# Patient Record
Sex: Male | Born: 1960
Health system: Southern US, Community
[De-identification: ages and names within clinical notes are randomized; demographics above are authoritative.]

## PROBLEM LIST (undated history)

## (undated) DIAGNOSIS — K219 Gastro-esophageal reflux disease without esophagitis: Secondary | ICD-10-CM

## (undated) DIAGNOSIS — I10 Essential (primary) hypertension: Secondary | ICD-10-CM

## (undated) DIAGNOSIS — J189 Pneumonia, unspecified organism: Secondary | ICD-10-CM

## (undated) DIAGNOSIS — G4730 Sleep apnea, unspecified: Secondary | ICD-10-CM

## (undated) DIAGNOSIS — K5730 Diverticulosis of large intestine without perforation or abscess without bleeding: Secondary | ICD-10-CM

## (undated) HISTORY — DX: Diverticulosis of large intestine without perforation or abscess without bleeding: K57.30

## (undated) HISTORY — DX: Gilbert syndrome: E80.4

## (undated) HISTORY — DX: Essential (primary) hypertension: I10

## (undated) HISTORY — DX: Gastro-esophageal reflux disease without esophagitis: K21.9

## (undated) HISTORY — PX: APPENDECTOMY: SHX54

## (undated) HISTORY — PX: VASECTOMY: SHX75

---

## 2001-03-10 ENCOUNTER — Encounter: Admission: RE | Admit: 2001-03-10 | Discharge: 2001-03-10 | Payer: Self-pay | Admitting: Internal Medicine

## 2001-03-10 ENCOUNTER — Encounter: Payer: Self-pay | Admitting: Internal Medicine

## 2001-03-28 ENCOUNTER — Encounter: Payer: Self-pay | Admitting: Internal Medicine

## 2001-03-28 ENCOUNTER — Encounter: Admission: RE | Admit: 2001-03-28 | Discharge: 2001-03-28 | Payer: Self-pay | Admitting: Internal Medicine

## 2004-02-21 ENCOUNTER — Ambulatory Visit: Payer: Self-pay | Admitting: Internal Medicine

## 2004-10-12 ENCOUNTER — Ambulatory Visit: Payer: Self-pay | Admitting: Internal Medicine

## 2005-02-15 ENCOUNTER — Ambulatory Visit: Payer: Self-pay | Admitting: Internal Medicine

## 2005-06-04 ENCOUNTER — Ambulatory Visit: Payer: Self-pay | Admitting: Internal Medicine

## 2005-06-12 ENCOUNTER — Encounter: Admission: RE | Admit: 2005-06-12 | Discharge: 2005-06-12 | Payer: Self-pay | Admitting: Internal Medicine

## 2005-10-12 ENCOUNTER — Ambulatory Visit: Payer: Self-pay | Admitting: Internal Medicine

## 2005-11-27 ENCOUNTER — Ambulatory Visit: Payer: Self-pay | Admitting: Internal Medicine

## 2006-01-22 ENCOUNTER — Encounter: Admission: RE | Admit: 2006-01-22 | Discharge: 2006-04-22 | Payer: Self-pay | Admitting: Internal Medicine

## 2006-04-08 ENCOUNTER — Ambulatory Visit: Payer: Self-pay | Admitting: Family Medicine

## 2006-08-29 DIAGNOSIS — M109 Gout, unspecified: Secondary | ICD-10-CM

## 2006-11-26 ENCOUNTER — Ambulatory Visit: Payer: Self-pay | Admitting: Internal Medicine

## 2006-11-26 LAB — CONVERTED CEMR LAB
Mumps IgG: 1.13 — ABNORMAL HIGH
Rubella: 21.7 intl units/mL — ABNORMAL HIGH
Rubeola IgG: 2.26 — ABNORMAL HIGH

## 2006-11-28 ENCOUNTER — Ambulatory Visit: Payer: Self-pay | Admitting: Internal Medicine

## 2006-12-03 ENCOUNTER — Encounter (INDEPENDENT_AMBULATORY_CARE_PROVIDER_SITE_OTHER): Payer: Self-pay | Admitting: *Deleted

## 2007-01-13 ENCOUNTER — Ambulatory Visit: Payer: Self-pay | Admitting: Internal Medicine

## 2007-01-14 ENCOUNTER — Encounter (INDEPENDENT_AMBULATORY_CARE_PROVIDER_SITE_OTHER): Payer: Self-pay | Admitting: *Deleted

## 2007-08-28 ENCOUNTER — Telehealth (INDEPENDENT_AMBULATORY_CARE_PROVIDER_SITE_OTHER): Payer: Self-pay | Admitting: *Deleted

## 2007-09-01 ENCOUNTER — Ambulatory Visit: Payer: Self-pay | Admitting: Internal Medicine

## 2007-09-01 DIAGNOSIS — M545 Low back pain: Secondary | ICD-10-CM | POA: Insufficient documentation

## 2007-09-03 ENCOUNTER — Encounter (INDEPENDENT_AMBULATORY_CARE_PROVIDER_SITE_OTHER): Payer: Self-pay | Admitting: *Deleted

## 2007-09-10 ENCOUNTER — Telehealth (INDEPENDENT_AMBULATORY_CARE_PROVIDER_SITE_OTHER): Payer: Self-pay | Admitting: *Deleted

## 2007-09-10 ENCOUNTER — Ambulatory Visit: Payer: Self-pay | Admitting: Pulmonary Disease

## 2007-09-24 ENCOUNTER — Ambulatory Visit (HOSPITAL_BASED_OUTPATIENT_CLINIC_OR_DEPARTMENT_OTHER): Admission: RE | Admit: 2007-09-24 | Discharge: 2007-09-24 | Payer: Self-pay | Admitting: Pulmonary Disease

## 2007-09-24 ENCOUNTER — Ambulatory Visit: Payer: Self-pay | Admitting: Pulmonary Disease

## 2007-10-08 ENCOUNTER — Telehealth: Payer: Self-pay | Admitting: Pulmonary Disease

## 2007-10-30 ENCOUNTER — Encounter: Payer: Self-pay | Admitting: Internal Medicine

## 2007-12-15 ENCOUNTER — Ambulatory Visit: Payer: Self-pay | Admitting: Family Medicine

## 2007-12-15 LAB — CONVERTED CEMR LAB
Inflenza A Ag: NEGATIVE
Influenza B Ag: NEGATIVE

## 2007-12-22 ENCOUNTER — Ambulatory Visit: Payer: Self-pay | Admitting: Internal Medicine

## 2008-02-12 ENCOUNTER — Ambulatory Visit: Payer: Self-pay | Admitting: Internal Medicine

## 2008-02-13 ENCOUNTER — Encounter (INDEPENDENT_AMBULATORY_CARE_PROVIDER_SITE_OTHER): Payer: Self-pay | Admitting: *Deleted

## 2008-08-11 ENCOUNTER — Ambulatory Visit: Payer: Self-pay | Admitting: Family Medicine

## 2008-10-29 ENCOUNTER — Ambulatory Visit: Payer: Self-pay | Admitting: Internal Medicine

## 2008-11-01 ENCOUNTER — Ambulatory Visit: Payer: Self-pay | Admitting: Internal Medicine

## 2008-11-01 LAB — CONVERTED CEMR LAB
ALT: 28 units/L (ref 0–53)
AST: 23 units/L (ref 0–37)
Albumin: 4.3 g/dL (ref 3.5–5.2)
Alkaline Phosphatase: 60 units/L (ref 39–117)
BUN: 14 mg/dL (ref 6–23)
Basophils Absolute: 0 10*3/uL (ref 0.0–0.1)
Basophils Relative: 0.4 % (ref 0.0–3.0)
Bilirubin, Direct: 0.1 mg/dL (ref 0.0–0.3)
CO2: 31 meq/L (ref 19–32)
Calcium: 9.4 mg/dL (ref 8.4–10.5)
Chloride: 105 meq/L (ref 96–112)
Cholesterol: 144 mg/dL (ref 0–200)
Creatinine, Ser: 1.1 mg/dL (ref 0.4–1.5)
Eosinophils Absolute: 0.1 10*3/uL (ref 0.0–0.7)
Eosinophils Relative: 2.3 % (ref 0.0–5.0)
GFR calc non Af Amer: 75.53 mL/min (ref >60)
Glucose, Bld: 107 mg/dL — ABNORMAL HIGH (ref 70–99)
HCT: 46 % (ref 39.0–52.0)
HDL: 49.4 mg/dL (ref >39.00)
Hemoglobin: 16 g/dL (ref 13.0–17.0)
LDL Cholesterol: 83 mg/dL (ref 0–99)
Lymphocytes Relative: 34.4 % (ref 12.0–46.0)
Lymphs Abs: 1.7 10*3/uL (ref 0.7–4.0)
MCHC: 34.9 g/dL (ref 30.0–36.0)
MCV: 86.1 fL (ref 78.0–100.0)
Monocytes Absolute: 0.3 10*3/uL (ref 0.1–1.0)
Monocytes Relative: 7.2 % (ref 3.0–12.0)
Neutro Abs: 2.7 10*3/uL (ref 1.4–7.7)
Neutrophils Relative %: 55.7 % (ref 43.0–77.0)
PSA: 0.56 ng/mL (ref 0.10–4.00)
Platelets: 188 10*3/uL (ref 150.0–400.0)
Potassium: 4.1 meq/L (ref 3.5–5.1)
RBC: 5.35 M/uL (ref 4.22–5.81)
RDW: 12.5 % (ref 11.5–14.6)
Sodium: 142 meq/L (ref 135–145)
TSH: 1.47 microintl units/mL (ref 0.35–5.50)
Total Bilirubin: 1.2 mg/dL (ref 0.3–1.2)
Total CHOL/HDL Ratio: 3
Total Protein: 7.3 g/dL (ref 6.0–8.3)
Triglycerides: 60 mg/dL (ref 0.0–149.0)
VLDL: 12 mg/dL (ref 0.0–40.0)
WBC: 4.8 10*3/uL (ref 4.5–10.5)

## 2008-11-02 ENCOUNTER — Encounter (INDEPENDENT_AMBULATORY_CARE_PROVIDER_SITE_OTHER): Payer: Self-pay | Admitting: *Deleted

## 2008-11-02 ENCOUNTER — Encounter: Payer: Self-pay | Admitting: Internal Medicine

## 2008-11-02 LAB — CONVERTED CEMR LAB: Uric Acid, Serum: 9 mg/dL — ABNORMAL HIGH (ref 4.0–7.8)

## 2008-11-03 ENCOUNTER — Ambulatory Visit: Payer: Self-pay | Admitting: Internal Medicine

## 2008-11-03 DIAGNOSIS — G479 Sleep disorder, unspecified: Secondary | ICD-10-CM | POA: Insufficient documentation

## 2008-11-05 ENCOUNTER — Encounter (INDEPENDENT_AMBULATORY_CARE_PROVIDER_SITE_OTHER): Payer: Self-pay | Admitting: *Deleted

## 2009-02-09 ENCOUNTER — Ambulatory Visit: Payer: Self-pay | Admitting: Internal Medicine

## 2009-02-09 DIAGNOSIS — T887XXA Unspecified adverse effect of drug or medicament, initial encounter: Secondary | ICD-10-CM

## 2009-02-10 LAB — CONVERTED CEMR LAB
BUN: 18 mg/dL (ref 6–23)
Creatinine, Ser: 1.1 mg/dL (ref 0.4–1.5)

## 2009-02-11 ENCOUNTER — Telehealth (INDEPENDENT_AMBULATORY_CARE_PROVIDER_SITE_OTHER): Payer: Self-pay | Admitting: *Deleted

## 2009-02-11 ENCOUNTER — Encounter (INDEPENDENT_AMBULATORY_CARE_PROVIDER_SITE_OTHER): Payer: Self-pay | Admitting: *Deleted

## 2009-03-24 ENCOUNTER — Ambulatory Visit: Payer: Self-pay | Admitting: Internal Medicine

## 2009-03-24 ENCOUNTER — Encounter (INDEPENDENT_AMBULATORY_CARE_PROVIDER_SITE_OTHER): Payer: Self-pay | Admitting: *Deleted

## 2009-04-02 HISTORY — PX: COLONOSCOPY: SHX174

## 2009-05-25 ENCOUNTER — Telehealth (INDEPENDENT_AMBULATORY_CARE_PROVIDER_SITE_OTHER): Payer: Self-pay | Admitting: *Deleted

## 2009-08-15 ENCOUNTER — Ambulatory Visit: Payer: Self-pay | Admitting: Internal Medicine

## 2009-08-15 DIAGNOSIS — D487 Neoplasm of uncertain behavior of other specified sites: Secondary | ICD-10-CM | POA: Insufficient documentation

## 2009-11-04 ENCOUNTER — Ambulatory Visit: Payer: Self-pay | Admitting: Internal Medicine

## 2009-11-04 ENCOUNTER — Encounter: Payer: Self-pay | Admitting: Gastroenterology

## 2009-11-30 ENCOUNTER — Telehealth (INDEPENDENT_AMBULATORY_CARE_PROVIDER_SITE_OTHER): Payer: Self-pay | Admitting: *Deleted

## 2009-12-06 ENCOUNTER — Encounter (INDEPENDENT_AMBULATORY_CARE_PROVIDER_SITE_OTHER): Payer: Self-pay | Admitting: *Deleted

## 2009-12-07 ENCOUNTER — Ambulatory Visit: Payer: Self-pay | Admitting: Gastroenterology

## 2009-12-19 ENCOUNTER — Ambulatory Visit: Payer: Self-pay | Admitting: Gastroenterology

## 2010-01-05 ENCOUNTER — Telehealth (INDEPENDENT_AMBULATORY_CARE_PROVIDER_SITE_OTHER): Payer: Self-pay | Admitting: *Deleted

## 2010-01-12 ENCOUNTER — Encounter: Payer: Self-pay | Admitting: Internal Medicine

## 2010-01-18 ENCOUNTER — Encounter: Payer: Self-pay | Admitting: Internal Medicine

## 2010-01-25 ENCOUNTER — Encounter: Payer: Self-pay | Admitting: Internal Medicine

## 2010-03-06 ENCOUNTER — Ambulatory Visit: Payer: Self-pay | Admitting: Internal Medicine

## 2010-03-06 LAB — CONVERTED CEMR LAB
Bilirubin Urine: NEGATIVE
Blood in Urine, dipstick: NEGATIVE
Ketones, urine, test strip: NEGATIVE
Nitrite: NEGATIVE
Specific Gravity, Urine: 1.015
pH: 6

## 2010-03-09 ENCOUNTER — Encounter: Payer: Self-pay | Admitting: Internal Medicine

## 2010-04-30 LAB — CONVERTED CEMR LAB
ALT: 26 units/L (ref 0–53)
ALT: 34 units/L (ref 0–53)
AST: 20 units/L (ref 0–37)
AST: 25 units/L (ref 0–37)
AST: 62 units/L — ABNORMAL HIGH (ref 0–37)
Albumin: 4.2 g/dL (ref 3.5–5.2)
Albumin: 4.4 g/dL (ref 3.5–5.2)
Albumin: 4.4 g/dL (ref 3.5–5.2)
Alkaline Phosphatase: 48 units/L (ref 39–117)
Alkaline Phosphatase: 53 units/L (ref 39–117)
Alkaline Phosphatase: 53 units/L (ref 39–117)
BUN: 11 mg/dL (ref 6–23)
BUN: 13 mg/dL (ref 6–23)
Basophils Absolute: 0 10*3/uL (ref 0.0–0.1)
Basophils Absolute: 0 10*3/uL (ref 0.0–0.1)
Basophils Absolute: 0 10*3/uL (ref 0.0–0.1)
Basophils Relative: 0.5 % (ref 0.0–3.0)
Basophils Relative: 0.7 % (ref 0.0–3.0)
Basophils Relative: 0.8 % (ref 0.0–1.0)
Bilirubin Urine: NEGATIVE
Bilirubin, Direct: 0.1 mg/dL (ref 0.0–0.3)
Bilirubin, Direct: 0.1 mg/dL (ref 0.0–0.3)
Blood in Urine, dipstick: NEGATIVE
CO2: 29 meq/L (ref 19–32)
CO2: 32 meq/L (ref 19–32)
Calcium: 9.4 mg/dL (ref 8.4–10.5)
Calcium: 9.5 mg/dL (ref 8.4–10.5)
Calcium: 9.8 mg/dL (ref 8.4–10.5)
Chloride: 102 meq/L (ref 96–112)
Chloride: 103 meq/L (ref 96–112)
Cholesterol: 157 mg/dL (ref 0–200)
Cholesterol: 163 mg/dL (ref 0–200)
Creatinine, Ser: 0.9 mg/dL (ref 0.4–1.5)
Creatinine, Ser: 1.1 mg/dL (ref 0.4–1.5)
Eosinophils Absolute: 0.1 10*3/uL (ref 0.0–0.6)
Eosinophils Absolute: 0.1 10*3/uL (ref 0.0–0.7)
Eosinophils Relative: 1.9 % (ref 0.0–5.0)
Eosinophils Relative: 2.3 % (ref 0.0–5.0)
Free T4: 1 ng/dL (ref 0.6–1.6)
GFR calc Af Amer: 116 mL/min
GFR calc Af Amer: 92 mL/min
GFR calc non Af Amer: 76 mL/min
GFR calc non Af Amer: 83.96 mL/min (ref >60)
GFR calc non Af Amer: 96 mL/min
Glucose, Bld: 103 mg/dL — ABNORMAL HIGH (ref 70–99)
Glucose, Bld: 97 mg/dL (ref 70–99)
Glucose, Urine, Semiquant: NEGATIVE
HCT: 46.5 % (ref 39.0–52.0)
HCT: 48.5 % (ref 39.0–52.0)
HDL: 45.6 mg/dL (ref >39.0)
HDL: 50.8 mg/dL (ref >39.0)
HDL: 54.4 mg/dL (ref >39.00)
Hemoglobin: 15.9 g/dL (ref 13.0–17.0)
Hemoglobin: 16.1 g/dL (ref 13.0–17.0)
Hemoglobin: 16.5 g/dL (ref 13.0–17.0)
Hgb A1c MFr Bld: 5.2 % (ref 4.6–6.5)
Ketones, urine, test strip: NEGATIVE
LDL Cholesterol: 105 mg/dL — ABNORMAL HIGH (ref 0–99)
LDL Cholesterol: 95 mg/dL (ref 0–99)
Lymphocytes Relative: 31.7 % (ref 12.0–46.0)
Lymphocytes Relative: 32.4 % (ref 12.0–46.0)
Lymphocytes Relative: 35.9 % (ref 12.0–46.0)
MCHC: 34.1 g/dL (ref 30.0–36.0)
MCHC: 34.1 g/dL (ref 30.0–36.0)
MCV: 85.9 fL (ref 78.0–100.0)
MCV: 86.6 fL (ref 78.0–100.0)
Monocytes Absolute: 0.4 10*3/uL (ref 0.1–1.0)
Monocytes Absolute: 0.4 10*3/uL (ref 0.2–0.7)
Monocytes Relative: 7.3 % (ref 3.0–12.0)
Monocytes Relative: 7.7 % (ref 3.0–11.0)
Monocytes Relative: 7.8 % (ref 3.0–12.0)
Neutro Abs: 3.1 10*3/uL (ref 1.4–7.7)
Neutro Abs: 3.2 10*3/uL (ref 1.4–7.7)
Neutro Abs: 3.3 10*3/uL (ref 1.4–7.7)
Neutrophils Relative %: 57.4 % (ref 43.0–77.0)
Neutrophils Relative %: 57.5 % (ref 43.0–77.0)
Nitrite: NEGATIVE
PSA: 0.6 ng/mL (ref 0.10–4.00)
PSA: 0.69 ng/mL (ref 0.10–4.00)
Platelets: 190 10*3/uL (ref 150–400)
Platelets: 214 10*3/uL (ref 150–400)
Potassium: 3.9 meq/L (ref 3.5–5.1)
Potassium: 4 meq/L (ref 3.5–5.1)
Protein, U semiquant: NEGATIVE
RBC: 5.23 M/uL (ref 4.22–5.81)
RBC: 5.42 M/uL (ref 4.22–5.81)
RBC: 5.6 M/uL (ref 4.22–5.81)
RDW: 12.2 % (ref 11.5–14.6)
RDW: 12.6 % (ref 11.5–14.6)
RDW: 13.2 % (ref 11.5–14.6)
Sodium: 138 meq/L (ref 135–145)
Sodium: 139 meq/L (ref 135–145)
Sodium: 142 meq/L (ref 135–145)
Specific Gravity, Urine: <1.005
TSH: 1.45 microintl units/mL (ref 0.35–5.50)
TSH: 1.69 microintl units/mL (ref 0.35–5.50)
Total Bilirubin: 0.9 mg/dL (ref 0.3–1.2)
Total Bilirubin: 1 mg/dL (ref 0.3–1.2)
Total CHOL/HDL Ratio: 3
Total CHOL/HDL Ratio: 3.1
Total CHOL/HDL Ratio: 3.6
Total Protein: 6.7 g/dL (ref 6.0–8.3)
Total Protein: 7.1 g/dL (ref 6.0–8.3)
Triglycerides: 54 mg/dL (ref 0–149)
Triglycerides: 61 mg/dL (ref 0–149)
Uric Acid, Serum: 7.6 mg/dL (ref 4.0–7.8)
Urobilinogen, UA: 0.2
VLDL: 11 mg/dL (ref 0–40)
VLDL: 12 mg/dL (ref 0–40)
VLDL: 12.6 mg/dL (ref 0.0–40.0)
WBC Urine, dipstick: NEGATIVE
WBC: 5.2 10*3/uL (ref 4.5–10.5)
WBC: 5.5 10*3/uL (ref 4.5–10.5)
pH: 6.5

## 2010-05-02 NOTE — Procedures (Signed)
Summary: Colonoscopy  Patient: Eric Aguirre Note: All result statuses are Final unless otherwise noted.  Tests: (1) Colonoscopy (COL)   COL Colonoscopy           DONE     [facility]     [address]     [place], [place]  [address]           COLONOSCOPY PROCEDURE REPORT           PATIENT:  [name], [name]  MR#:  [id number]     BIRTHDATE:  December 29, 1960, 49 yrs. old  GENDER:  male     ENDOSCOPIST:  Vania Rea. Jarold Motto, MD, [facility]     REF. BY:  Marga Melnick, M.D.     PROCEDURE DATE:  12/19/2009     PROCEDURE:  Average-risk screening colonoscopy     G0121     ASA CLASS:  Class I     INDICATIONS:  Routine Risk Screening     MEDICATIONS:   Fentanyl 100 mcg IV, Versed 10 mg IV           DESCRIPTION OF PROCEDURE:   After the risks benefits and     alternatives of the procedure were thoroughly explained, informed     consent was obtained.  Digital rectal exam was performed and     revealed no abnormalities.   The LB CF-[id number] endoscope     was introduced through the anus and advanced to the cecum, which     was identified by both the appendix and ileocecal valve, without     limitations.  The quality of the prep was excellent, using     MoviPrep.  The instrument was then slowly withdrawn as the colon     was fully examined.     <<PROCEDUREIMAGES>>           FINDINGS:  Mild diverticulosis was found in the sigmoid to     descending colon segments. ALSO SCATTERED RIGHT COLON DIVERTICULAE     NOTED.  No polyps or cancers were seen.   Retroflexed views in the     rectum revealed no abnormalities.    The scope was then withdrawn     from the patient and the procedure completed.           COMPLICATIONS:  None     ENDOSCOPIC IMPRESSION:     1) Mild diverticulosis in the sigmoid to descending colon     segments     2) No polyps or cancers     3) Normal colonoscopy otherwise     RECOMMENDATIONS:     1) high fiber diet     2) Continue current colorectal screening  recommendations for     "routine risk" patients with a repeat colonoscopy in 10 years.     REPEAT EXAM:  No           ______________________________     Vania Rea. Jarold Motto, MD, [facility]           CC:           n.     eSIGNED:   Vania Rea. Patterson at 12/19/2009 11:40 AM           Charlane Ferretti, [id number]  Note: An exclamation mark (!) indicates a result that was not dispersed into the flowsheet. Document Creation Date: 12/19/2009 11:40 AM _______________________________________________________________________  (1) Order result status: Final Collection or observation date-time: 12/19/2009 11:32 Requested date-time:  Receipt date-time:  Reported date-time:  Referring Physician:   Ordering Physician: Sheryn Bison [phone]) Specimen Source:  Source: Launa Grill Order Number: [id number] Lab site:   Appended Document: Colonoscopy    Clinical Lists Changes  Observations: Added new observation of COLONNXTDUE: 12/2019 (12/19/2009 12:36)

## 2010-05-02 NOTE — Miscellaneous (Signed)
Summary: [facility]  Clinical Lists Changes  Medications: Added new medication of MOVIPREP 100 GM  SOLR (PEG-KCL-NACL-NASULF-NA ASC-C) As per prep instructions. - Signed Rx of MOVIPREP 100 GM  SOLR (PEG-KCL-NACL-NASULF-NA ASC-C) As per prep instructions.;  #1 x 0;  Signed;  Entered by: Ezra Sites RN;  Authorized by: Mardella Layman MD [facility];  Method used: Electronically to [facility]. [place]. [phone]*, [address] [place], [place], [place]  [address], Ph: [phone] or [phone], Fax: [phone] Observations: Added new observation of ALLERGY REV: Done (12/07/2009 14:12)    Prescriptions: MOVIPREP 100 GM  SOLR (PEG-KCL-NACL-NASULF-NA ASC-C) As per prep instructions.  #1 x 0   Entered by:   Ezra Sites RN   Authorized by:   Mardella Layman MD [facility]   Signed by:   Ezra Sites RN on 12/07/2009   Method used:   Electronically to        [facility]. [address] [phone]* (retail)       [address]       [place], [place]  [address]       Ph: [phone] or [phone]       Fax: [phone]   RxID:   [id number]

## 2010-05-02 NOTE — Progress Notes (Signed)
Summary: refill  Phone Note Refill Request Call back at Work Phone [phone]   Refills Requested: Medication #1:  COLCRYS 0.6 MG TABS as directed.  pt states that he curently has a acute gout flare up. pt is currently out of this med and needs a refill.pt  [facility]..........Marland KitchenFelecia Deloach CMA  November 30, 2009 10:29 AM    Follow-up for Phone Call        Patient was given rx on 11/04/2009 #30 with 5 refills.  I left message on VM informing patient that he was given rx and would need to forward rx to the pharmacy.  If patient still needs our assistance patient to call Follow-up by: Shonna Chock CMA,  November 30, 2009 11:33 AM

## 2010-05-02 NOTE — Assessment & Plan Note (Signed)
Summary: CPX/KDC   Vital Signs:  Patient profile:   50 year old male Height:      68.5 inches Weight:      176.4 pounds BMI:     26.53 Temp:     98.2 degrees F oral Pulse rate:   60 / minute Resp:     14 per minute BP sitting:   102 / 66  (left arm) Cuff size:   large  Vitals Entered By: Shonna Chock CMA (November 04, 2009 8:12 AM)  CC: CPX with fasting labs , General Medical Evaluation   Primary Care Provider:  Marga Melnick MD  CC:  CPX with fasting labs  and General Medical Evaluation.  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   Allergies: 1)  ! Tussionex Pennkinetic Er (Chlorpheniramine-Hydrocodone) 2)  ! Augmentin (Amoxicillin-Pot Clavulanate) 3)  ! Uloric (Febuxostat)  Past History:  Past Medical History: LOW BACK PAIN, CHRONIC  GERD (ICD-530.81) GILBERT'S SYNDROME (ICD-277.4) (total bilirubin 1.6 in 2007) GOUT (ICD-274.9) (Uric Acid 9.0 in 09/2008)  Past Surgical History: Vasectomy; Lasik Ophthalmologic  Surgery bilaterally; Wisdom Teeth Extraction  Family History: Father:HTN, cns aneurysm, DM   Mother: goiter, lipids, dementia( Frontal Lobe) Siblings: sister: fibromyalgia; P uncle:cancer ? primary; MGGM :arthritis, breast cancer ; MGF: DM; MGM: DM  Social History: Never Smoked Alcohol use-yes: socially 2  cups coffee daily Married - Nature conservation officer Rep with [employer]  Regular exercise-yes: walking & "Reformer" , total 4 hrs /week  Review of Systems General:  Complains of sleep disorder; denies chills, fatigue, fever, and sweats; Difficulty falling asleep & frequent awakening. Eyes:  Denies blurring, double vision, and vision loss-both eyes. ENT:  Denies difficulty swallowing, hoarseness, and ringing in ears; "Hearing Attention Deficit" as per Audiologist. CV:  Denies chest pain or discomfort, palpitations, shortness of breath with exertion,  swelling of feet, and swelling of hands. Resp:  Denies cough, excessive snoring, hypersomnolence, morning headaches, and sputum productive. GI:  Denies abdominal pain, bloody stools, constipation, dark tarry stools, diarrhea, and indigestion; Burping at bedtime . GU:  Denies discharge, dysuria, and hematuria. MS:  Complains of low back pain; denies joint pain, joint redness, joint swelling, mid back pain, and thoracic pain; No recent gout. Derm:  Denies changes in nail beds, dryness, lesion(s), and rash; Some thinning . Neuro:  Denies numbness, tingling, and weakness. Psych:  Denies anxiety, depression, easily angered, easily tearful, and irritability. Endo:  Denies cold intolerance, excessive hunger, excessive thirst, excessive urination, and heat intolerance. Heme:  Denies abnormal bruising and bleeding. Allergy:  Denies itching eyes and sneezing.  Physical Exam  General:  well-nourished; alert,appropriate and cooperative throughout examination Head:  Normocephalic and atraumatic without obvious abnormalities. Eyes:  No corneal or conjunctival inflammation noted.Perrla. Funduscopic exam benign, without hemorrhages, exudates or papilledema.  Ears:  External ear exam shows no significant lesions or deformities.  Otoscopic examination reveals clear canals, tympanic membranes are intact bilaterally without bulging, retraction, inflammation or discharge. Nose:  External nasal examination shows no deformity or inflammation. Nasal mucosa are pink and moist without lesions or exudates. Mouth:  Oral mucosa and oropharynx without lesions or exudates.  Teeth in good repair. Neck:  No deformities, masses, or tenderness noted.? cervical rib on L Lungs:  Normal respiratory effort, chest expands symmetrically. Lungs are clear to auscultation, no crackles or wheezes. Heart:  regular rhythm, no murmur, no gallop, no rub, no  JVD, no HJR, and bradycardia.   Abdomen:  Bowel sounds positive,abdomen soft and  non-tender without masses, organomegaly or hernias noted. Rectal:  No external abnormalities noted. Normal sphincter tone. No rectal masses or tenderness. Genitalia:  Testes bilaterally descended without nodularity, tenderness or masses. No scrotal masses or lesions. No penis lesions or urethral discharge. L varicocele.   Prostate:  Prostate gland firm and smooth, ULN   w/o  enlargement; no  nodularity, tenderness, mass, asymmetry or induration. Msk:  No deformity or scoliosis noted of thoracic or lumbar spine.   Pulses:  R and L carotid,radial,dorsalis pedis and posterior tibial pulses are full and equal bilaterally Extremities:  No clubbing, cyanosis, edema, or deformity noted with normal full range of motion of all joints.   Minor ridging of L great toenail Neurologic:  alert & oriented X3, gait normal, and DTRs symmetrical and normal.   Skin:  Intact without suspicious lesions or rashes Cervical Nodes:  No lymphadenopathy noted Axillary Nodes:  No palpable lymphadenopathy Inguinal Nodes:  No significant adenopathy Psych:  memory intact for recent and remote, normally interactive, and good eye contact.     Impression & Recommendations:  Problem # 1:  ROUTINE GENERAL MEDICAL EXAM@HEALTH  CARE FACL (ICD-V70.0)  Orders: EKG w/ Interpretation (93[address]) Venipuncture ([address]) TLB-Lipid Panel (80061-LIPID) TLB-BMP (Basic Metabolic Panel-BMET) (80048-METABOL) TLB-CBC Platelet - w/Differential (85025-CBCD) TLB-Hepatic/Liver Function Pnl (80076-HEPATIC) TLB-TSH (Thyroid Stimulating Hormone) (84443-TSH) TLB-Uric Acid, Blood (84550-URIC) TLB-PSA (Prostate Specific Antigen) (84153-PSA)  Problem # 2:  SLEEP DISORDER (ICD-780.50)  Orders: Misc. Referral (Misc. Ref)  Problem # 3:  LOW BACK PAIN, CHRONIC (ICD-724.2) Stable , better with Reformer  Problem # 4:  GOUT (ICD-274.9)  The following medications were removed from the medication list:    Colchicine 0.6 Mg Tabs (Colchicine) .Marland Kitchen... Take as  directed His updated medication list for this problem includes:    Allopurinol 300 Mg Tabs (Allopurinol) .Marland Kitchen... 1 once daily    Colcrys 0.6 Mg Tabs (Colchicine) .Marland Kitchen... As directed  Orders: Venipuncture ([address]) TLB-Uric Acid, Blood (84550-URIC)  Complete Medication List: 1)  Clonazepam 0.5 Mg Tabs (Clonazepam) .Marland Kitchen.. 1-2 at bedtime as needed 2)  Fish Oil 1000 Mg Caps (Omega-3 fatty acids) .Marland Kitchen.. 1 by mouth once daily 3)  Vitamin C Cr 1500 Mg Cr-tabs (Ascorbic acid) .Marland Kitchen.. 1 by mouth once daily 4)  Allopurinol 300 Mg Tabs (Allopurinol) .Marland Kitchen.. 1 once daily 5)  Vitamin E 400 Unit Caps (Vitamin e) .Marland Kitchen.. 1 by mouth once daily 6)  Multivitamins Tabs (Multiple vitamin) .Marland Kitchen.. 1 by mouth once daily 7)  Colcrys 0.6 Mg Tabs (Colchicine) .... As directed  Other Orders: Gastroenterology Referral (GI)  Patient Instructions: 1)  The Reformer Pilates Program is medically indicated prophylactically for your Chronic Low Back Pain up to 3X/ week. Prescriptions: COLCRYS 0.6 MG TABS (COLCHICINE) as directed  #30 x 5   Entered and Authorized by:   Marga Melnick MD   Signed by:   Marga Melnick MD on 11/04/2009   Method used:   Print then Give to Patient   RxID:   [id number]     Appended Document: CPX/KDC

## 2010-05-02 NOTE — Letter (Signed)
Summary: [facility] Instructions  [facility]  [address] [place], [place] [address]   Phone: [phone]  Fax: [phone]       ABDIRAHIM FLAVELL    Feb 04, 1961    MRN: [id number]        Procedure Day Dorna Bloom: Duanne Limerick  12/19/09     Arrival Time: 10:30am     Procedure Time: 11:30am     Location of Procedure:                    _X _  [facility] (4th Floor)                        PREPARATION FOR COLONOSCOPY WITH MOVIPREP   Starting 5 days prior to your procedure  [facility] 09/14  do not eat nuts, seeds, popcorn, corn, beans, peas,  salads, or any raw vegetables.  Do not take any fiber supplements (e.g. Metamucil, Citrucel, and Benefiber).  THE DAY BEFORE YOUR PROCEDURE         DATE:  SUNDAY  09/18  1.  Drink clear liquids the entire day-NO SOLID FOOD  2.  Do not drink anything colored red or purple.  Avoid juices with pulp.  No orange juice.  3.  Drink at least 64 oz. (8 glasses) of fluid/clear liquids during the day to prevent dehydration and help the prep work efficiently.  CLEAR LIQUIDS INCLUDE: Water Jello Ice Popsicles Tea (sugar ok, no milk/cream) Powdered fruit flavored drinks Coffee (sugar ok, no milk/cream) Gatorade Juice: apple, white grape, white cranberry  Lemonade Clear bullion, consomm, broth Carbonated beverages (any kind) Strained chicken noodle soup Hard Candy                             4.  In the morning, mix first dose of MoviPrep solution:    Empty 1 Pouch A and 1 Pouch B into the disposable container    Add lukewarm drinking water to the top line of the container. Mix to dissolve    Refrigerate (mixed solution should be used within 24 hrs)  5.  Begin drinking the prep at 5:00 p.m. The MoviPrep container is divided by 4 marks.   Every 15 minutes drink the solution down to the next mark (approximately 8 oz) until the full liter is complete.   6.  Follow completed prep with 16 oz of clear liquid of your choice  (Nothing red or purple).  Continue to drink clear liquids until bedtime.  7.  Before going to bed, mix second dose of MoviPrep solution:    Empty 1 Pouch A and 1 Pouch B into the disposable container    Add lukewarm drinking water to the top line of the container. Mix to dissolve    Refrigerate  THE DAY OF YOUR PROCEDURE      DATE:  MONDAY  09/19  Beginning at  6:30 a.m. (5 hours before procedure):         1. Every 15 minutes, drink the solution down to the next mark (approx 8 oz) until the full liter is complete.  2. Follow completed prep with 16 oz. of clear liquid of your choice.    3. You may drink clear liquids until 9:30am  (2 HOURS BEFORE PROCEDURE).   MEDICATION INSTRUCTIONS  Unless otherwise instructed, you should take regular prescription medications with a small sip of water   as early as  possible the morning of your procedure.           OTHER INSTRUCTIONS  You will need a responsible adult at least 51 years of age to accompany you and drive you home.   This person must remain in the waiting room during your procedure.  Wear loose fitting clothing that is easily removed.  Leave jewelry and other valuables at home.  However, you may wish to bring a book to read or  an iPod/MP3 player to listen to music as you wait for your procedure to start.  Remove all body piercing jewelry and leave at home.  Total time from sign-in until discharge is approximately 2-3 hours.  You should go home directly after your procedure and rest.  You can resume normal activities the  day after your procedure.  The day of your procedure you should not:   Drive   Make legal decisions   Operate machinery   Drink alcohol   Return to work  You will receive specific instructions about eating, activities and medications before you leave.    The above instructions have been reviewed and explained to me by   Ezra Sites RN  September  7, [birth year] 2011 2:44 PM     I fully  understand and can verbalize these instructions _____________________________ Date _________

## 2010-05-02 NOTE — Consult Note (Signed)
Summary: [facility]   Imported By: Lanelle Bal 01/31/2010 08:37:03  _____________________________________________________________________  External Attachment:    Type:   Image     Comment:   External Document

## 2010-05-02 NOTE — Assessment & Plan Note (Signed)
Summary: infected cold sore//lch   Vital Signs:  Patient profile:   50 year old male Weight:      176.8 pounds BMI:     26.20 Temp:     98.4 degrees F oral Pulse rate:   72 / minute Resp:     14 per minute BP sitting:   116 / 78  (left arm) Cuff size:   large  Vitals Entered By: Shonna Chock (Aug 15, 2009 4:35 PM) CC: 1. Infected cold sore   2.) Discuss Meds  Comments REVIEWED MED LIST, PATIENT AGREED DOSE AND INSTRUCTION CORRECT    Primary Care Provider:  Marga Melnick MD  CC:  1. Infected cold sore   2.) Discuss Meds .  History of Present Illness: Jillyn Hidden has an infected herpes labialis ; it started 2 weeks ago as a red ,raised lesion w/o vesicleor pustule .  Marland Kitchen He took Famcyclovir 2 pills two times a day w/o improvement. Subsequently he cut it shaving repeatedly . There has been  no purulence even lancing with sterile needle . Rx: Neosporin of no benefit   Allergies: 1)  ! Tussionex Pennkinetic Er (Chlorpheniramine-Hydrocodone) 2)  ! Augmentin (Amoxicillin-Pot Clavulanate) 3)  ! Uloric (Febuxostat)  Review of Systems General:  Denies chills, fever, and sweats. MS:  Gout post shellfish in 05/2009. Derm:  Complains of lesion(s); denies itching.  Physical Exam  General:  well-nourished,in no acute distress; alert,appropriate and cooperative throughout examination Skin:  6X6 mm raised red, non tender  lesion L upper lip above vermillion border Cervical Nodes:  No lymphadenopathy noted Axillary Nodes:  No palpable lymphadenopathy   Impression & Recommendations:  Problem # 1:  NEOPLASM UNCERTAIN BEHAVIOR OTHER SPEC SITES (ICD-238.8) R/O Pyogenic Granuloma  Problem # 2:  GOUT (ICD-274.9) quiescent The following medications were removed from the medication list:    Uloric 40 Mg Tabs (Febuxostat) .Marland Kitchen... 1 once daily His updated medication list for this problem includes:    Colchicine 0.6 Mg Tabs (Colchicine) .Marland Kitchen... Take as directed    Allopurinol 300 Mg Tabs (Allopurinol)  .Marland Kitchen... 1 once daily  Complete Medication List: 1)  Colchicine 0.6 Mg Tabs (Colchicine) .... Take as directed 2)  Clonazepam 0.5 Mg Tabs (Clonazepam) .Marland Kitchen.. 1-2 at bedtime as needed 3)  Sertraline Hcl 50 Mg Tabs (Sertraline hcl) .... Take 1/2 once daily 4)  Fish Oil 1000 Mg Caps (Omega-3 fatty acids) .Marland Kitchen.. 1 by mouth once daily 5)  Vitamin C Cr 1500 Mg Cr-tabs (Ascorbic acid) .Marland Kitchen.. 1 by mouth once daily 6)  Doxycycline Hyclate 100 Mg Caps (Doxycycline hyclate) .Marland Kitchen.. 1 two times a day ; avoid direct sun 7)  Bactroban 2 % Crea (Mupirocin calcium) .... Apply two times a day after cleansing 8)  Allopurinol 300 Mg Tabs (Allopurinol) .Marland Kitchen.. 1 once daily  Patient Instructions: 1)  Please schedule a follow-up Lab  appointment in 3 months for uric acid (274.9). Prescriptions: ALLOPURINOL 300 MG TABS (ALLOPURINOL) 1 once daily  #90 x 1   Entered and Authorized by:   Marga Melnick MD   Signed by:   Marga Melnick MD on 08/15/2009   Method used:   Faxed to ...       [facility] [address] [phone]* (retail)       [address]       [place], [place]  [address]       Ph: [phone] or [phone]       Fax:  [phone]   RxID:   [id number] BACTROBAN 2 % CREA (MUPIROCIN CALCIUM) apply two times a day after cleansing  #15 grams x 1   Entered and Authorized by:   Marga Melnick MD   Signed by:   Marga Melnick MD on 08/15/2009   Method used:   Faxed to ...       [facility] [address] [phone]* (retail)       [address]       [place], [place]  [address]       Ph: [phone] or [phone]       Fax: [phone]   RxID:   [id number] DOXYCYCLINE HYCLATE 100 MG CAPS (DOXYCYCLINE HYCLATE) 1 two times a day ; avoid direct sun  #20 x 0   Entered and Authorized by:   Marga Melnick MD   Signed by:   Marga Melnick MD on 08/15/2009   Method used:   Faxed to ...       [facility] [address] [phone]* (retail)       [address]       [place], [place]  [address]       Ph: [phone] or [phone]       Fax: [phone]   RxID:   [id number]   Appended Document: infected cold sore//lch Called in RX's patient said the pharmacy never recieved them

## 2010-05-02 NOTE — Letter (Signed)
Summary: Previsit letter  [facility]  [address] [place], [place] [address]   Phone: [phone]  Fax: [phone]       11/04/2009 MRN: [id number]  [facility] [address] [place], [place]  [address]  Dear Eric Aguirre,  Welcome to the Gastroenterology Division at [facility].    You are scheduled to see a nurse for your pre-procedure visit on 12-07-09 at 2:30pm on the 3rd floor at [facility], [address]  We ask that you try to arrive at our office 15 minutes prior to your appointment time to allow for check-in.  Your nurse visit will consist of discussing your medical and surgical history, your immediate family medical history, and your medications.    Please bring a complete list of all your medications or, if you prefer, bring the medication bottles and we will list them.  We will need to be aware of both prescribed and over the counter drugs.  We will need to know exact dosage information as well.  If you are on blood thinners (Coumadin, Plavix, Aggrenox, Ticlid, etc.) please call our office today/prior to your appointment, as we need to consult with your physician about holding your medication.   Please be prepared to read and sign documents such as consent forms, a financial agreement, and acknowledgement forms.  If necessary, and with your consent, a friend or relative is welcome to sit-in on the nurse visit with you.  Please bring your insurance card so that we may make a copy of it.  If your insurance requires a referral to see a specialist, please bring your referral form from your primary care physician.  No co-pay is required for this nurse visit.     If you cannot keep your appointment, please call [phone] to cancel or reschedule prior to your appointment date.  This allows [place] the opportunity to schedule an appointment for another patient in need of care.    Thank you for choosing [facility] for your medical needs.  We  appreciate the opportunity to care for you.  Please visit [place] at our website  to learn more about our practice.                     Sincerely.                                                                                                                   The Gastroenterology Division

## 2010-05-02 NOTE — Progress Notes (Signed)
Summary: Request for Gout Med  Phone Note Call from Patient Call back at Work Phone [phone]   Caller: Patient Summary of Call: Message left on voicemail: Patient with acute flare up x 3 weeks and needs indomethacin called in   I called patient and he said when he has an acute gout flare up he takes indomethacine and Colcrys, patient indicated that indomethacine should of never been removed from his list cause he does take when he has a flare up. Patient also mentioned he would like to consider Rheumatology  referral  [facility] CMA  January 05, 2010 3:09 PM     New/Updated Medications: INDOMETHACIN 50 MG CAPS (INDOMETHACIN) 1 by mouth once daily as needed Prescriptions: INDOMETHACIN 50 MG CAPS (INDOMETHACIN) 1 by mouth once daily as needed  #30 x 1   Entered by:   Shonna Chock CMA   Authorized by:   Marga Melnick MD   Signed by:   Shonna Chock CMA on 01/05/2010   Method used:   Electronically to        [facility]. [address] [phone]* (retail)       [address]       [place], [place]  [address]       Ph: [phone] or [phone]       Fax: [phone]   RxID:   [id number]

## 2010-05-02 NOTE — Assessment & Plan Note (Signed)
Summary: lower back.cbs   Vital Signs:  Patient profile:   50 year old male Weight:      180.2 pounds BMI:     27.10 Temp:     98.3 degrees F oral Pulse rate:   60 / minute Resp:     15 per minute BP sitting:   122 / 84  (left arm) Cuff size:   large  Vitals Entered By: Shonna Chock CMA (March 06, 2010 9:05 AM) CC: Lower back pain since 12/20/2009 and right knee pain x 1 month , Back pain   Primary Care Provider:  Marga Melnick MD  CC:  Lower back pain since 12/20/2009 and right knee pain x 1 month  and Back pain.  History of Present Illness: Back Pain      This is a 50 year old man who presents with Back pain.  The patient denies fever, chills, weakness, loss of sensation, fecal incontinence, urinary incontinence, and urinary retention.  The dull, constant  pain is located in the right low back.  The pain began suddenly, the day after his colonoscopy 12/19/2009.  The pain radiates to the right hip, not down RLE.  The pain is made worse by flexion.  The pain is made better by  rest/inactivity. Rx: Aleve helps. It does appear to be improving. No PMH of back injury.  Current Medications (verified): 1)  Clonazepam 0.5 Mg Tabs (Clonazepam) .Marland Kitchen.. 1-2 At Bedtime As Needed 2)  Allopurinol 300 Mg Tabs (Allopurinol) .Marland Kitchen.. 1 Once Daily 3)  Colcrys 0.6 Mg Tabs (Colchicine) .... As Directed 4)  Indomethacin 50 Mg Caps (Indomethacin) .Marland Kitchen.. 1 By Mouth Once Daily As Needed  Allergies: 1)  ! Tussionex Pennkinetic Er (Hydrocod Polst-Chlorphen Polst) 2)  ! Augmentin (Amoxicillin-Pot Clavulanate) 3)  ! Uloric (Febuxostat)  Review of Systems GI:  Denies bloody stools, constipation, and dark tarry stools. GU:  Denies discharge and hematuria. Derm:  Denies lesion(s) and rash.  Physical Exam  General:  in no acute distress; alert,appropriate and cooperative throughout examination Abdomen:  Bowel sounds positive,abdomen soft and non-tender without masses, organomegaly or hernias noted. Msk:   No flank pain with percussion. He lay down & sat up w/o help Pulses:  R and L dorsalis pedis and posterior tibial pulses are full and equal bilaterally Extremities:  No clubbing, cyanosis, edema, or deformity noted with normal full range of motion of all joints.  Neg SLR to 90 degrees  Neurologic:  alert & oriented X3, strength normal in all extremities, gait(heel & toe)  normal, and DTRs symmetrical and normal.   Skin:  Intact without suspicious lesions or rashes Cervical Nodes:  No lymphadenopathy noted Axillary Nodes:  No palpable lymphadenopathy Psych:  normally interactive and good eye contact.     Impression & Recommendations:  Problem # 1:  LOW BACK PAIN SYNDROME (ICD-724.2)  His updated medication list for this problem includes:    Indomethacin 50 Mg Caps (Indomethacin) .Marland Kitchen... 1 by mouth once daily as needed  Orders: T-Lumbar Spine Comp w/Bend View [id number])  Problem # 2:  GOUT (ICD-274.9) Uric acid checked today @ Dr Clarisa Schools office His updated medication list for this problem includes:    Allopurinol 300 Mg Tabs (Allopurinol) .Marland Kitchen... 1 once daily    Colcrys 0.6 Mg Tabs (Colchicine) .Marland Kitchen... As directed  Complete Medication List: 1)  Clonazepam 0.5 Mg Tabs (Clonazepam) .Marland Kitchen.. 1-2 at bedtime as needed 2)  Allopurinol 300 Mg Tabs (Allopurinol) .Marland Kitchen.. 1 once daily 3)  Colcrys 0.6 Mg  Tabs (Colchicine) .... As directed 4)  Indomethacin 50 Mg Caps (Indomethacin) .Marland Kitchen.. 1 by mouth once daily as needed  Other Orders: UA Dipstick w/o Micro (manual) (16109) Admin 1st Vaccine (60454) Flu Vaccine 24yrs + (09811)  Patient Instructions: 1)  Exercises as discussed  ; Physical  Therapy or Chiropractry if no better   Orders Added: 1)  UA Dipstick w/o Micro (manual) [81002] 2)  Admin 1st Vaccine [90471] 3)  Flu Vaccine 46yrs + [91478] 4)  Est. Patient Level III [29562] 5)  T-Lumbar Spine Comp w/Bend View [72114TC]    Laboratory Results   Urine Tests    Routine Urinalysis   Color:  lt. yellow Appearance: Clear Glucose: negative   (Normal Range: Negative) Bilirubin: negative   (Normal Range: Negative) Ketone: negative   (Normal Range: Negative) Spec. Gravity: 1.015   (Normal Range: 1.003-1.035) Blood: negative   (Normal Range: Negative) pH: 6.0   (Normal Range: 5.0-8.0) Protein: negative   (Normal Range: Negative) Urobilinogen: 0.2   (Normal Range: 0-1) Nitrite: negative   (Normal Range: Negative) Leukocyte Esterace: negative   (Normal Range: Negative)       .lbflu   Flu Vaccine Consent Questions     Do you have a history of severe allergic reactions to this vaccine? no    Any prior history of allergic reactions to egg and/or gelatin? no    Do you have a sensitivity to the preservative Thimersol? no    Do you have a past history of Guillan-Barre Syndrome? no    Do you currently have an acute febrile illness? no    Have you ever had a severe reaction to latex? no    Vaccine information given and explained to patient? yes    Are you currently pregnant? no    Lot Number:[id number]   Exp Date:09/30/2010   Site Given  Left Deltoid IM

## 2010-05-02 NOTE — Progress Notes (Signed)
Summary: rx request  Phone Note Call from Patient Call back at Work Phone [phone]   Caller: patient email Summary of Call: Emails states: Alfonse Flavors will you please call in for me either Zoloft  of Citalapram?(not Prozac) [facility] [phone]. I have been dealing with placing my mother into an assisted living a couple weeks ago followed by another move this week to [employer] unit.  I'm seeing Veto Kemps and she has suggested I talk to you for the RX. If you need me to come in for a visit just let me know. I plan to be on the meds for a mininum of six months. Initial call taken by: Jeremy Johann CMA,  May 25, 2009 2:11 PM  Follow-up for Phone Call        per dr hopper sertraline 50mg  1/2 once daily x6 days then 1 tab once daily # 30 5 refills..........Marland KitchenFelecia Deloach CMA  May 25, 2009 2:12 PM   pt aware................Marland KitchenFelecia Deloach CMA  May 25, 2009 2:14 PM     New/Updated Medications: SERTRALINE HCL 50 MG TABS (SERTRALINE HCL) Take 1/2 once daily x6 days then 1 tab once daily Prescriptions: SERTRALINE HCL 50 MG TABS (SERTRALINE HCL) Take 1/2 once daily x6 days then 1 tab once daily  #30 x 5   Entered by:   Jeremy Johann CMA   Authorized by:   Marga Melnick MD   Signed by:   Jeremy Johann CMA on 05/25/2009   Method used:   Faxed to ...       [facility] [address] # [phone]* (retail)       [address]       [place], [place]  [address]       Ph: [phone]       Fax: [phone]   RxID:   [id number]

## 2010-08-15 NOTE — Procedures (Signed)
NAMESHAMARION, COOTS NO.:  [id number]   MEDICAL RECORD NO.:  [id number]          PATIENT TYPE:  OUT   LOCATION:  SLEEP CENTER                 FACILITY:  [facility]   PHYSICIAN:  Coralyn Helling, MD        DATE OF BIRTH:  1960/04/30   DATE OF STUDY:  09/24/2007                            NOCTURNAL POLYSOMNOGRAM   REFERRING PHYSICIAN:  Coralyn Helling, MD   INDICATION FOR STUDY:  Mr. Renwick is a 50 year old male who has a  history of sleep disruption with excessive daytime sleepiness.  He is  referred to the sleep lab for evaluation of hypersomnia with obstructive  sleep apnea.   Height is 5 feet 9 inches tall, weight is 168 pounds, BMI is 25, neck  size is 16.5.   EPWORTH SLEEPINESS SCORE:  11.   MEDICATIONS:  Allopurinol and Celebrex.   SLEEP ARCHITECTURE:  Total recording time was 448 minutes, total sleep  time was 256 minutes, sleep efficiency was 57%, sleep latency was 37  minutes which was prolonged.  Of note, is that the patient was reading a  book prior to initiating sleep.  REM latency was 200 minutes which was  prolonged.  The patient was observed in all stages of sleep.  The  patient slept in supine position.   RESPIRATORY DATA:  The average respiratory rate was 22.  The apnea  hypopnea index was 4.  The respiratory disturbance index was 11.2.  The  events were exclusively obstructive in nature.  The REM apnea hypopnea  index was 15, the non-REM apnea hypopnea index was 1.9.  The supine  apnea hypopnea index was 4.2.  The  non-supine apnea hypopnea index was  0.9.  Moderate snoring was noted by the technician.   OXYGEN DATA:  The baseline oxygenation was 95%.  The oxygen saturation  nadir was 88%.  The patient spent a total of 0.2 minutes of test time  with an oxygen saturation less than 90%.   CARDIAC DATA:  The average heart rate was 76 and the rhythm strip showed  normal sinus rhythm with occasional PACs.   MOVEMENT-PARASOMNIA:  The periodic limb  movement index was 0.  The  patient had one restroom trip.   IMPRESSION/RECOMMENDATIONS:  This study shows evidence for mild  obstructive sleep apnea as demonstrated by respiratory disturbance index  of 11.2 and oxygen saturation nadir of 88%.  What I recommend is the  patient be counseled with regards to the importance of diet, exercise  and weight reduction.  In addition,  positional therapy could be tried.  If these interventions are  unsuccessful, then additional therapeutic options could include CPAP  therapy, oral appliance or surgical interaction.      Coralyn Helling, MD  Diplomat, American Board of Sleep Medicine  Electronically Signed     VS/MEDQ  D:  10/06/2007 12:53:02  T:  10/06/2007 13:52:02  Job:  [id number]

## 2010-11-07 ENCOUNTER — Encounter: Payer: Self-pay | Admitting: Internal Medicine

## 2010-11-13 ENCOUNTER — Ambulatory Visit (INDEPENDENT_AMBULATORY_CARE_PROVIDER_SITE_OTHER): Payer: 59 | Admitting: Internal Medicine

## 2010-11-13 ENCOUNTER — Encounter: Payer: Self-pay | Admitting: Internal Medicine

## 2010-11-13 DIAGNOSIS — M109 Gout, unspecified: Secondary | ICD-10-CM

## 2010-11-13 DIAGNOSIS — G479 Sleep disorder, unspecified: Secondary | ICD-10-CM

## 2010-11-13 DIAGNOSIS — R7309 Other abnormal glucose: Secondary | ICD-10-CM

## 2010-11-13 DIAGNOSIS — Z0000 Encounter for general adult medical examination without abnormal findings: Secondary | ICD-10-CM

## 2010-11-13 NOTE — Patient Instructions (Signed)
Preventive Health Care: Exercise at least 30-45 minutes a day,  3-4 days a week.  Eat a low-fat diet with lots of fruits and vegetables, up to 7-9 servings per day. consume less than 40 grams of sugar per day from foods & drinks with High Fructose Corn Sugar as #1,2,3 or # 4 on label.

## 2010-11-13 NOTE — Progress Notes (Signed)
Subjective:    Patient ID: Eric Aguirre, male    DOB: 1961-01-04, 50 y.o.   MRN: [id number]  HPI  Eric Aguirre  is here for a physical; he has no acute issues .     Review of Systems Patient reports no  vision/ hearing changes,anorexia, weight change, fever ,adenopathy, persistant / recurrent hoarseness, swallowing issues, chest pain,palpitations, edema,persistant / recurrent cough, hemoptysis, dyspnea(rest, exertional, paroxysmal nocturnal), gastrointestinal  bleeding (melena, rectal bleeding), abdominal pain, excessive heart burn, GU symptoms( dysuria, hematuria, pyuria, voiding/incontinence  issues) syncope, focal weakness, memory loss,numbness & tingling, skin/hair/nail changes,depression, anxiety, abnormal bruising/bleeding, or musculoskeletal symptoms/signs.  He has had no gout attacks; Dr. Dierdre Forth has prescribed that allopurinol 300 mg daily, "never  be stopped". 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 He previously was on CPAP; this is been discontinued and he  is having no issues. Sleep issues have completely resolved with job change. He states his insurance rated him  Because of Gilbert's Syndrome. This syndrome is not indicative of liver disease and should cause no health problems.     Objective:   Physical Exam Gen.: Healthy and well-nourished in appearance. Alert, appropriate and cooperative throughout exam. Head: Normocephalic without obvious abnormalities Eyes: No corneal or conjunctival inflammation noted. Pupils equal round reactive to light and accommodation. Fundal exam is benign without hemorrhages, exudate, papilledema. Extraocular motion intact. Vision grossly normal. Ears: External  ear exam reveals no significant lesions or deformities. Canals clear .TMs normal. Hearing is grossly normal bilaterally. Nose: External nasal exam reveals no deformity or inflammation. Nasal mucosa are pink and moist. No lesions or exudates noted.  Mouth: Oral mucosa and oropharynx reveal no lesions or exudates. Teeth in good repair. Neck: No deformities, masses, or tenderness noted. Range of motion &. Thyroid normal. Lungs: Normal respiratory effort; chest expands symmetrically. Lungs are clear to auscultation without rales, wheezes, or increased work of breathing. Heart: Normal rate and rhythm. Normal S1 and S2. No gallop, click, or rub. S4 w/o  murmur. Abdomen: Bowel sounds normal; abdomen soft and nontender. No masses, organomegaly or hernias noted. Firm abdominal musculature  Genitalia/DRE: Normal exam; no nodularity or induration of the prostate.   .                                                                                   Musculoskeletal/extremities: No deformity or scoliosis noted of  the thoracic or lumbar spine. No clubbing, cyanosis, edema, or deformity noted. Range of motion  normal .Tone & strength  normal.Joints normal. Nail health  good. Vascular: Carotid, radial artery, dorsalis pedis and  posterior tibial pulses are full and equal. No bruits present. Neurologic: Alert and oriented x3. Deep tendon reflexes symmetrical and normal.          Skin: Intact without suspicious lesions or rashes. Lymph: No cervical, axillary, or inguinal lymphadenopathy present. Psych: Mood and affect are normal. Normally interactive  Assessment & Plan:  #1 comprehensive physical exam; no acute findings #2 see Problem List with Assessments & Recommendations Plan: see Orders

## 2010-11-14 LAB — LIPID PANEL
LDL Cholesterol: 79 mg/dL (ref 0–99)
Total CHOL/HDL Ratio: 2
Triglycerides: 59 mg/dL (ref 0.0–149.0)

## 2010-11-14 LAB — CBC WITH DIFFERENTIAL/PLATELET
Basophils Absolute: 0 10*3/uL (ref 0.0–0.1)
Eosinophils Relative: 1 % (ref 0.0–5.0)
HCT: 45.7 % (ref 39.0–52.0)
Lymphs Abs: 2.1 10*3/uL (ref 0.7–4.0)
MCV: 87.7 fl (ref 78.0–100.0)
Monocytes Absolute: 0.4 10*3/uL (ref 0.1–1.0)
Platelets: 190 10*3/uL (ref 150.0–400.0)
RDW: 12.9 % (ref 11.5–14.6)

## 2010-11-14 LAB — TSH: TSH: 0.52 u[IU]/mL (ref 0.35–5.50)

## 2010-11-14 LAB — HEPATIC FUNCTION PANEL
Albumin: 4.5 g/dL (ref 3.5–5.2)
Alkaline Phosphatase: 46 U/L (ref 39–117)
Bilirubin, Direct: 0.2 mg/dL (ref 0.0–0.3)
Total Protein: 7.1 g/dL (ref 6.0–8.3)

## 2010-11-14 LAB — BASIC METABOLIC PANEL
BUN: 12 mg/dL (ref 6–23)
Chloride: 103 mEq/L (ref 96–112)
Glucose, Bld: 101 mg/dL — ABNORMAL HIGH (ref 70–99)
Potassium: 4.2 mEq/L (ref 3.5–5.1)

## 2010-11-17 LAB — HEMOGLOBIN A1C: Hgb A1c MFr Bld: 5.2 % (ref 4.6–6.5)

## 2011-02-10 ENCOUNTER — Encounter: Payer: Self-pay | Admitting: Internal Medicine

## 2011-02-10 ENCOUNTER — Ambulatory Visit (INDEPENDENT_AMBULATORY_CARE_PROVIDER_SITE_OTHER): Payer: 59 | Admitting: Internal Medicine

## 2011-02-10 DIAGNOSIS — R109 Unspecified abdominal pain: Secondary | ICD-10-CM

## 2011-02-10 NOTE — Progress Notes (Signed)
  Subjective:    Patient ID: Eric Aguirre, male    DOB: [birth year], 50 y.o.   MRN: [id number]  HPI Has had 4-5 days of stomach cramping RLQ pain Has sensation of "pressurization" Was out of town in [place] till last night---stressful business  Had been eating all meals in hotel Hasn't been eating well for several weeks Nausea but no vomiting  Normal stools and color. No blood in stool No fever No ill exposures  Cramping is "consistent" now--not related to meals or bowels Had benign colonoscopy 2011  Current Outpatient Prescriptions on File Prior to Visit  Medication Sig Dispense Refill  . allopurinol (ZYLOPRIM) 300 MG tablet Take 300 mg by mouth daily.        Marland Kitchen aspirin 81 MG tablet Take 81 mg by mouth daily.          Allergies  Allergen Reactions  . BJY:NWGNFAOZHYQ+MVHQIONGE+XBMWUXLKGM Acid+Aspartame     REACTION: RASH see OV  12-22-07  . Tussionex Pennkinetic Er     REACTION: RASH see OV note 12-22-07    Past Medical History  Diagnosis Date  . Gout 2005    uric acid 9   . Sullivan Lone syndrome     Past Surgical History  Procedure Date  . Vasectomy   . Colonoscopy 2011    negative     Family History  Problem Relation Age of Onset  . Diabetes Father   . Hypertension Father     cns aneurysm rupture  . Cancer Paternal Uncle     ? primary  . Arthritis      MGGM  . Thyroid disease Mother     goiter  . Dementia Mother     History   Social History  . Marital Status: Married    Spouse Name: N/A    Number of Children: N/A  . Years of Education: N/A   Occupational History  . Not on file.   Social History Main Topics  . Smoking status: Never Smoker   . Smokeless tobacco: Not on file  . Alcohol Use: 3.6 oz/week    6 Cans of beer per week  . Drug Use: No  . Sexually Active: Not on file   Other Topics Concern  . Not on file   Social History Narrative  . No narrative on file   Review of Systems Weight is down 12# since last physical No cough or  SOB    Objective:   Physical Exam  Constitutional: He appears well-developed and well-nourished. No distress.  Neck: Normal range of motion. Neck supple.  Pulmonary/Chest: Effort normal and breath sounds normal. No respiratory distress. He has no wheezes. He has no rales.  Abdominal: Soft. Bowel sounds are normal. He exhibits no distension and no mass. There is tenderness. There is guarding. There is no rebound.       sllight RLQ tenderness and guarding Able to palpate deeply over time though without worrisome findings No RUQ tenderness  Lymphadenopathy:    He has no cervical adenopathy.  Psychiatric: His behavior is normal. Judgment and thought content normal.       Mild anxiety          Assessment & Plan:

## 2011-02-10 NOTE — Assessment & Plan Note (Signed)
No worrisome features on exam--just some mild RLQ tenderness Recent benign colonoscopy Has lots of stress---new difficult position. May be related to this  P: no Rx for now     Discussed relaxation techniques     If pain persists, consider ultrasound

## 2011-02-12 ENCOUNTER — Other Ambulatory Visit: Payer: Self-pay | Admitting: Internal Medicine

## 2011-02-12 ENCOUNTER — Encounter (HOSPITAL_COMMUNITY): Payer: Self-pay | Admitting: General Surgery

## 2011-02-12 ENCOUNTER — Encounter (HOSPITAL_COMMUNITY): Admission: EM | Disposition: A | Discharge status: Home / Self Care | Payer: Self-pay | Source: Home / Self Care

## 2011-02-12 ENCOUNTER — Ambulatory Visit
Admission: RE | Admit: 2011-02-12 | Discharge: 2011-02-12 | Disposition: A | Discharge status: Home / Self Care | Payer: 59 | Source: Ambulatory Visit | Attending: Internal Medicine | Admitting: Internal Medicine

## 2011-02-12 ENCOUNTER — Emergency Department (HOSPITAL_COMMUNITY): Payer: 59 | Admitting: Anesthesiology

## 2011-02-12 ENCOUNTER — Telehealth: Payer: Self-pay | Admitting: Internal Medicine

## 2011-02-12 ENCOUNTER — Inpatient Hospital Stay (HOSPITAL_COMMUNITY)
Admission: EM | Admit: 2011-02-12 | Discharge: 2011-02-14 | DRG: 340 | Disposition: A | Discharge status: Home / Self Care | Payer: 59 | Attending: Surgery | Admitting: Surgery

## 2011-02-12 ENCOUNTER — Other Ambulatory Visit (INDEPENDENT_AMBULATORY_CARE_PROVIDER_SITE_OTHER): Payer: Self-pay | Admitting: Surgery

## 2011-02-12 ENCOUNTER — Encounter (HOSPITAL_COMMUNITY): Payer: Self-pay | Admitting: Anesthesiology

## 2011-02-12 DIAGNOSIS — R110 Nausea: Secondary | ICD-10-CM

## 2011-02-12 DIAGNOSIS — K3532 Acute appendicitis with perforation and localized peritonitis, without abscess: Secondary | ICD-10-CM | POA: Diagnosis present

## 2011-02-12 DIAGNOSIS — R109 Unspecified abdominal pain: Secondary | ICD-10-CM

## 2011-02-12 DIAGNOSIS — M109 Gout, unspecified: Secondary | ICD-10-CM

## 2011-02-12 DIAGNOSIS — K3580 Unspecified acute appendicitis: Secondary | ICD-10-CM

## 2011-02-12 DIAGNOSIS — K3533 Acute appendicitis with perforation and localized peritonitis, with abscess: Principal | ICD-10-CM | POA: Diagnosis present

## 2011-02-12 HISTORY — DX: Sleep apnea, unspecified: G47.30

## 2011-02-12 HISTORY — PX: LAPAROSCOPIC APPENDECTOMY: SHX408

## 2011-02-12 HISTORY — DX: Pneumonia, unspecified organism: J18.9

## 2011-02-12 LAB — CREATININE, SERUM
Creatinine, Ser: 0.93 mg/dL (ref 0.50–1.35)
GFR calc Af Amer: >90 mL/min (ref >90)

## 2011-02-12 LAB — CBC
MCV: 84.1 fL (ref 78.0–100.0)
Platelets: 160 10*3/uL (ref 150–400)
RBC: 5.08 MIL/uL (ref 4.22–5.81)
RDW: 12.6 % (ref 11.5–15.5)
WBC: 19 10*3/uL — ABNORMAL HIGH (ref 4.0–10.5)

## 2011-02-12 SURGERY — APPENDECTOMY, LAPAROSCOPIC
Anesthesia: General

## 2011-02-12 SURGERY — APPENDECTOMY, LAPAROSCOPIC
Anesthesia: General | Site: Abdomen | Wound class: Dirty or Infected

## 2011-02-12 MED ORDER — KETOROLAC TROMETHAMINE 30 MG/ML IJ SOLN
INTRAMUSCULAR | Status: DC | PRN
  Administered 2011-02-12: 30 mg via INTRAVENOUS

## 2011-02-12 MED ORDER — SODIUM CHLORIDE 0.9 % IR SOLN
Status: DC | PRN
  Administered 2011-02-12: 1000 mL

## 2011-02-12 MED ORDER — HYDROMORPHONE HCL PF 1 MG/ML IJ SOLN: 0.2500 mg | INTRAMUSCULAR | Status: DC | PRN

## 2011-02-12 MED ORDER — BUPIVACAINE-EPINEPHRINE 0.5% -1:200000 IJ SOLN
INTRAMUSCULAR | Status: DC | PRN
  Administered 2011-02-12: 20 mL

## 2011-02-12 MED ORDER — LACTATED RINGERS IV SOLN
INTRAVENOUS | Status: DC | PRN
  Administered 2011-02-12: 17:00:00 via INTRAVENOUS

## 2011-02-12 MED ORDER — ONDANSETRON HCL 4 MG/2ML IJ SOLN
INTRAMUSCULAR | Status: DC | PRN
  Administered 2011-02-12: 4 mg via INTRAVENOUS

## 2011-02-12 MED ORDER — ENOXAPARIN SODIUM 40 MG/0.4ML ~~LOC~~ SOLN
40.0000 mg | SUBCUTANEOUS | Status: DC
  Administered 2011-02-13 – 2011-02-14 (×2): 40 mg via SUBCUTANEOUS
  Filled 2011-02-12 (×3): qty 0.4

## 2011-02-12 MED ORDER — PROPOFOL 10 MG/ML IV EMUL
INTRAVENOUS | Status: DC | PRN
  Administered 2011-02-12: 170 mg via INTRAVENOUS

## 2011-02-12 MED ORDER — METRONIDAZOLE IN NACL 5-0.79 MG/ML-% IV SOLN
INTRAVENOUS | Status: DC | PRN
  Administered 2011-02-12: 500 mg via INTRAVENOUS

## 2011-02-12 MED ORDER — CIPROFLOXACIN IN D5W 400 MG/200ML IV SOLN
INTRAVENOUS | Status: DC | PRN
  Administered 2011-02-12: 400 mg via INTRAVENOUS

## 2011-02-12 MED ORDER — FENTANYL CITRATE 0.05 MG/ML IJ SOLN
INTRAMUSCULAR | Status: DC | PRN
  Administered 2011-02-12: 150 ug via INTRAVENOUS
  Administered 2011-02-12: 100 ug via INTRAVENOUS

## 2011-02-12 MED ORDER — MIDAZOLAM HCL 5 MG/5ML IJ SOLN
INTRAMUSCULAR | Status: DC | PRN
  Administered 2011-02-12: 2 mg via INTRAVENOUS

## 2011-02-12 MED ORDER — HYDROMORPHONE HCL PF 1 MG/ML IJ SOLN: 2.0000 mg | INTRAMUSCULAR | Status: DC | PRN

## 2011-02-12 MED ORDER — KCL IN DEXTROSE-NACL 20-5-0.9 MEQ/L-%-% IV SOLN
INTRAVENOUS | Status: DC
  Administered 2011-02-12 – 2011-02-13 (×2): via INTRAVENOUS
  Filled 2011-02-12 (×3): qty 1000

## 2011-02-12 MED ORDER — MEPERIDINE HCL 25 MG/ML IJ SOLN: 6.2500 mg | INTRAMUSCULAR | Status: DC | PRN

## 2011-02-12 MED ORDER — KETOROLAC TROMETHAMINE 30 MG/ML IJ SOLN
30.0000 mg | Freq: Four times a day (QID) | INTRAMUSCULAR | Status: DC
  Administered 2011-02-12 – 2011-02-13 (×2): 30 mg via INTRAVENOUS
  Filled 2011-02-12 (×11): qty 1

## 2011-02-12 MED ORDER — ONDANSETRON HCL 4 MG/2ML IJ SOLN: 4.0000 mg | Freq: Four times a day (QID) | INTRAMUSCULAR | Status: DC | PRN

## 2011-02-12 MED ORDER — HYDROCODONE-ACETAMINOPHEN 5-325 MG PO TABS
1.0000 | ORAL_TABLET | ORAL | Status: DC | PRN
  Administered 2011-02-13 (×2): 2 via ORAL
  Administered 2011-02-14: 1 via ORAL
  Filled 2011-02-12 (×3): qty 2

## 2011-02-12 MED ORDER — METRONIDAZOLE IN NACL 5-0.79 MG/ML-% IV SOLN
500.0000 mg | Freq: Three times a day (TID) | INTRAVENOUS | Status: DC
  Administered 2011-02-12 – 2011-02-14 (×5): 500 mg via INTRAVENOUS
  Filled 2011-02-12 (×6): qty 100

## 2011-02-12 MED ORDER — CIPROFLOXACIN IN D5W 400 MG/200ML IV SOLN
400.0000 mg | Freq: Two times a day (BID) | INTRAVENOUS | Status: DC
  Administered 2011-02-13 – 2011-02-14 (×3): 400 mg via INTRAVENOUS
  Filled 2011-02-12 (×5): qty 200

## 2011-02-12 MED ORDER — CIPROFLOXACIN IN D5W 400 MG/200ML IV SOLN: 400.0000 mg | Freq: Two times a day (BID) | INTRAVENOUS | Status: DC

## 2011-02-12 MED ORDER — IOHEXOL 300 MG/ML  SOLN
100.0000 mL | Freq: Once | INTRAMUSCULAR | Status: AC | PRN
  Administered 2011-02-12: 100 mL via INTRAVENOUS

## 2011-02-12 MED ORDER — GLYCOPYRROLATE 0.2 MG/ML IJ SOLN
INTRAMUSCULAR | Status: DC | PRN
  Administered 2011-02-12: .6 mg via INTRAVENOUS

## 2011-02-12 MED ORDER — METRONIDAZOLE IN NACL 5-0.79 MG/ML-% IV SOLN
500.0000 mg | Freq: Three times a day (TID) | INTRAVENOUS | Status: DC
  Filled 2011-02-12 (×3): qty 100

## 2011-02-12 MED ORDER — ACETAMINOPHEN 10 MG/ML IV SOLN
INTRAVENOUS | Status: DC | PRN
  Administered 2011-02-12: 1000 mg via INTRAVENOUS

## 2011-02-12 MED ORDER — ROCURONIUM BROMIDE 100 MG/10ML IV SOLN
INTRAVENOUS | Status: DC | PRN
  Administered 2011-02-12: 50 mg via INTRAVENOUS

## 2011-02-12 MED ORDER — ONDANSETRON HCL 4 MG PO TABS: 4.0000 mg | ORAL_TABLET | Freq: Four times a day (QID) | ORAL | Status: DC | PRN

## 2011-02-12 MED ORDER — SODIUM CHLORIDE 0.9 % IV SOLN
INTRAVENOUS | Status: DC | PRN
  Administered 2011-02-12: 17:00:00 via INTRAVENOUS

## 2011-02-12 MED ORDER — NEOSTIGMINE METHYLSULFATE 1 MG/ML IJ SOLN
INTRAMUSCULAR | Status: DC | PRN
  Administered 2011-02-12: 3 mg via INTRAVENOUS

## 2011-02-12 MED ORDER — LACTATED RINGERS IV SOLN: INTRAVENOUS | Status: DC

## 2011-02-12 MED ORDER — PROMETHAZINE HCL 25 MG/ML IJ SOLN
6.2500 mg | INTRAMUSCULAR | Status: DC | PRN
  Filled 2011-02-12: qty 1

## 2011-02-12 SURGICAL SUPPLY — 46 items
APPLIER CLIP LOGIC TI 5 (MISCELLANEOUS) IMPLANT
APPLIER CLIP ROT 10 11.4 M/L (STAPLE)
BANDAGE ADHESIVE 1X3 (GAUZE/BANDAGES/DRESSINGS) ×6 IMPLANT
BANDAID FLEXIBLE 1X3 (GAUZE/BANDAGES/DRESSINGS) ×6 IMPLANT
BENZOIN TINCTURE PRP APPL 2/3 (GAUZE/BANDAGES/DRESSINGS) ×6 IMPLANT
CANISTER SUCTION 2500CC (MISCELLANEOUS) ×2 IMPLANT
CHLORAPREP W/TINT 26ML (MISCELLANEOUS) ×2 IMPLANT
CLIP APPLIE ROT 10 11.4 M/L (STAPLE) IMPLANT
CLOSURE STERI STRIP 1/2 X4 (GAUZE/BANDAGES/DRESSINGS) ×6 IMPLANT
CLOTH BEACON ORANGE TIMEOUT ST (SAFETY) ×2 IMPLANT
COVER SURGICAL LIGHT HANDLE (MISCELLANEOUS) ×2 IMPLANT
CUTTER LINEAR ENDO 35 ETS (STAPLE) ×2 IMPLANT
CUTTER LINEAR ENDO 35 ETS TH (STAPLE) IMPLANT
DECANTER SPIKE VIAL GLASS SM (MISCELLANEOUS) IMPLANT
ELECT REM PT RETURN 9FT ADLT (ELECTROSURGICAL) ×2
ELECTRODE REM PT RTRN 9FT ADLT (ELECTROSURGICAL) ×1 IMPLANT
ENDOLOOP SUT PDS II  0 18 (SUTURE)
ENDOLOOP SUT PDS II 0 18 (SUTURE) IMPLANT
EVACUATOR SILICONE 100CC (DRAIN) ×2 IMPLANT
GAUZE SPONGE 2X2 8PLY STRL LF (GAUZE/BANDAGES/DRESSINGS) ×1 IMPLANT
GLOVE BIOGEL PI IND STRL 6.5 (GLOVE) ×1 IMPLANT
GLOVE BIOGEL PI INDICATOR 6.5 (GLOVE) ×1
GLOVE ECLIPSE 6.5 STRL STRAW (GLOVE) ×2 IMPLANT
GLOVE SURG SIGNA 7.5 PF LTX (GLOVE) ×2 IMPLANT
GOWN PREVENTION PLUS XLARGE (GOWN DISPOSABLE) ×2 IMPLANT
GOWN STRL NON-REIN LRG LVL3 (GOWN DISPOSABLE) ×2 IMPLANT
KIT BASIN OR (CUSTOM PROCEDURE TRAY) ×2 IMPLANT
KIT ROOM TURNOVER OR (KITS) ×2 IMPLANT
NS IRRIG 1000ML POUR BTL (IV SOLUTION) ×2 IMPLANT
PAD ARMBOARD 7.5X6 YLW CONV (MISCELLANEOUS) ×2 IMPLANT
POUCH SPECIMEN RETRIEVAL 10MM (ENDOMECHANICALS) ×2 IMPLANT
RELOAD /EVU35 (ENDOMECHANICALS) IMPLANT
RELOAD CUTTER ETS 35MM STAND (ENDOMECHANICALS) ×2 IMPLANT
SCALPEL HARMONIC ACE (MISCELLANEOUS) ×2 IMPLANT
SET IRRIG TUBING LAPAROSCOPIC (IRRIGATION / IRRIGATOR) ×4 IMPLANT
SLEEVE ENDOPATH XCEL 5M (ENDOMECHANICALS) ×2 IMPLANT
SPECIMEN JAR SMALL (MISCELLANEOUS) ×2 IMPLANT
SPONGE GAUZE 2X2 STER 10/PKG (GAUZE/BANDAGES/DRESSINGS) ×1
SUT ETHILON 2 0 FS 18 (SUTURE) ×2 IMPLANT
SUT MON AB 4-0 PC3 18 (SUTURE) ×2 IMPLANT
TOWEL OR 17X24 6PK STRL BLUE (TOWEL DISPOSABLE) ×2 IMPLANT
TOWEL OR 17X26 10 PK STRL BLUE (TOWEL DISPOSABLE) ×2 IMPLANT
TRAY LAPAROSCOPIC (CUSTOM PROCEDURE TRAY) ×2 IMPLANT
TROCAR XCEL BLUNT TIP 100MML (ENDOMECHANICALS) ×2 IMPLANT
TROCAR XCEL NON-BLD 5MMX100MML (ENDOMECHANICALS) ×2 IMPLANT
WATER STERILE IRR 1000ML POUR (IV SOLUTION) IMPLANT

## 2011-02-12 NOTE — ED Notes (Signed)
Pt presents to ED for eval of right lower quadrant pain since Thursday. Reports nausea. Severe tenderness with palpation. Pt was seen at his PCP prior to arrival and was sent over to ED for appendectomy. Dr Magnus Ivan has thus seen pt and explained procedure. Pt is in gown and all clothes in belongings bag. OR consent is signed. IV established. Antibiotics to be given in OR. Pt verbalized understanding of poc.

## 2011-02-12 NOTE — Anesthesia Postprocedure Evaluation (Signed)
  Anesthesia Post-op Note  Patient: Eric Aguirre  Procedure(s) Performed:  APPENDECTOMY LAPAROSCOPIC - Laparoscopic Appendectomy  Patient Location: PACU  Anesthesia Type: General  Level of Consciousness: awake, alert  and oriented  Airway and Oxygen Therapy: Patient Spontanous Breathing and Patient connected to nasal cannula oxygen  Post-op Pain: mild  Post-op Assessment: Post-op Vital signs reviewed  Post-op Vital Signs: stable  Complications: No apparent anesthesia complications

## 2011-02-12 NOTE — ED Provider Notes (Signed)
History     CSN: [id number] Arrival date & time: 02/12/2011  3:40 PM   First MD Initiated Contact with Patient 02/12/11 1553      Chief Complaint  Patient presents with  . Abdominal Pain    (Consider location/radiation/quality/duration/timing/severity/associated sxs/prior treatment) Patient is a 50 y.o. male presenting with abdominal pain. The history is provided by the patient and medical records.  Abdominal Pain The primary symptoms of the illness include abdominal pain, nausea and diarrhea. The primary symptoms of the illness do not include fever, fatigue, shortness of breath, vomiting, hematemesis, hematochezia or dysuria. The current episode started more than 2 days ago. The onset of the illness was gradual. The problem has been gradually worsening.  The abdominal pain is located in the RLQ. The abdominal pain does not radiate. The severity of the abdominal pain is 7/10. The abdominal pain is relieved by nothing. The abdominal pain is exacerbated by movement and eating.  Nausea began today.  Additional symptoms associated with the illness include anorexia. Symptoms associated with the illness do not include chills, diaphoresis, heartburn, constipation, urgency, hematuria, frequency or back pain. Associated medical issues comments: The patient is a 50 year old male who is sent in to the ED to meet Dr. Magnus Ivan of general surgery after having an outpatient CT scan of the abdomen showing acute appendicitis with possible perforation. Symptoms have been going on for approximately 5 days,.    Past Medical History  Diagnosis Date  . Gout 2005    uric acid 9   . Sullivan Lone syndrome     Past Surgical History  Procedure Date  . Vasectomy   . Colonoscopy 2011    negative     Family History  Problem Relation Age of Onset  . Diabetes Father   . Hypertension Father     cns aneurysm rupture  . Cancer Paternal Uncle     ? primary  . Arthritis      MGGM  . Thyroid disease Mother    goiter  . Dementia Mother     History  Substance Use Topics  . Smoking status: Never Smoker   . Smokeless tobacco: Not on file  . Alcohol Use: 3.6 oz/week    6 Cans of beer per week      Review of Systems  Constitutional: Negative for fever, chills, diaphoresis and fatigue.  HENT: Negative.   Eyes: Negative.   Respiratory: Negative for shortness of breath.   Cardiovascular: Negative for chest pain.  Gastrointestinal: Positive for nausea, abdominal pain, diarrhea and anorexia. Negative for heartburn, vomiting, constipation, blood in stool, hematochezia, abdominal distention, anal bleeding, rectal pain and hematemesis.  Genitourinary: Negative for dysuria, urgency, frequency and hematuria.  Musculoskeletal: Negative for back pain.  Skin: Negative for color change and rash.  Neurological: Negative.   Hematological: Negative.   Psychiatric/Behavioral: Negative.     Allergies  WUJ:WJXBJYNWGNF+AOZHYQMVH+QIONGEXBMW acid+aspartame and Tussionex pennkinetic er  Home Medications   Current Outpatient Rx  Name Route Sig Dispense Refill  . ALLOPURINOL 300 MG PO TABS Oral Take 300 mg by mouth daily.      . ASPIRIN 81 MG PO TABS Oral Take 81 mg by mouth daily.        BP 120/70  Pulse 61  Temp(Src) 99.3 F (37.4 C) (Oral)  Resp 14  SpO2 97%  Physical Exam  Nursing note and vitals reviewed. Constitutional: He is oriented to person, place, and time. He appears well-developed and well-nourished. No distress.  HENT:  Head: Normocephalic and  atraumatic.  Mouth/Throat: Oropharynx is clear and moist.  Eyes: Conjunctivae and EOM are normal. Pupils are equal, round, and reactive to light.  Neck: Normal range of motion.  Cardiovascular: Normal rate, regular rhythm, normal heart sounds and intact distal pulses.  Exam reveals no gallop and no friction rub.   No murmur heard. Pulmonary/Chest: Effort normal and breath sounds normal. No respiratory distress. He has no wheezes. He has no  rales. He exhibits no tenderness.  Abdominal: Soft. Bowel sounds are normal. He exhibits no distension. There is tenderness in the right lower quadrant. There is rebound and guarding.  Musculoskeletal: Normal range of motion. He exhibits no edema and no tenderness.  Neurological: He is alert and oriented to person, place, and time. He has normal reflexes. No cranial nerve deficit. He exhibits normal muscle tone. Coordination normal.  Skin: Skin is warm and dry. No rash noted. He is not diaphoretic. No erythema. No pallor.    ED Course  Procedures (including critical care time)  Labs Reviewed - No data to display Ct Abdomen Pelvis W Wo Contrast  02/12/2011  *RADIOLOGY REPORT*  Clinical Data: 50 year old male with abdominal and pelvic pain and nausea.  CT ABDOMEN AND PELVIS WITHOUT AND WITH CONTRAST  Technique:  Multidetector CT imaging of the abdomen and pelvis was performed without contrast material in one or both body regions, followed by contrast material(s) and further sections in one or both body regions.  Contrast: OMNIPAQUE IOHEXOL 300 MG/ML IV SOLN  Comparison: None  Findings: The appendix is enlarged with thickened walls and moderate adjacent inflammation compatible with appendicitis.  A small amount of free fluid adjacent to the appendix and within the right pericolic gutter likely represents perforation. There is no evidence of bowel obstruction or pneumoperitoneum.  Mildly enlarged right mesenteric lymph nodes are likely reactive.  The liver, pancreas, adrenal glands, gallbladder and kidneys are unremarkable. The spleen is upper limits of normal in size without focal splenic lesions.  There is no evidence of biliary dilatation or abdominal aortic aneurysm. No other bowel abnormalities are identified. The bladder is unremarkable.  A small amount of free fluid in the pelvis is noted.  No acute or suspicious bony abnormalities are noted.  IMPRESSION: Appendicitis with findings likely  representing perforation.  No evidence of pneumoperitoneum or bowel obstruction.  Upper limits normal spleen size.  These results were called to Dr. Dierdre Forth on 02/12/2011 at 1:50 p.m.  Original Report Authenticated By: Rosendo Gros, M.D.     No diagnosis found.    MDM  The patient has an apparent acute appendicitis and is going up to the operating room with Dr. Magnus Ivan.        Felisa Bonier, MD 02/12/11 [phone]

## 2011-02-12 NOTE — Telephone Encounter (Signed)
Pt seen at Saturday clinic.Please advise

## 2011-02-12 NOTE — H&P (Signed)
I have seen and examined the patient and agree with above plan.  I discussed the surgery with the patient in detail including the risks of bleeding, infection, injury, need to convert to an open procedure, ongoing infection, etc.   Likelihood of success is good.

## 2011-02-12 NOTE — Telephone Encounter (Signed)
Left message to call office

## 2011-02-12 NOTE — OR Nursing (Signed)
No valuables reported in shift change report.  Hospital locker key and valuables checklist found with patients chart at end of procedure.  Taken with patient to PACU.

## 2011-02-12 NOTE — Op Note (Signed)
02/12/2011  5:48 PM  PATIENT:  Eric Aguirre  50 y.o. male  PRE-OPERATIVE DIAGNOSIS:  acute appendicitis with perforation  POST-OPERATIVE DIAGNOSIS:  acute appendicitis with perforation  PROCEDURE:  Procedure(s): APPENDECTOMY LAPAROSCOPIC  SURGEON:  Surgeon(s): Shelly Rubenstein, MD  PHYSICIAN ASSISTANT:   ASSISTANTS: none   ANESTHESIA:   local and general  EBL:     BLOOD ADMINISTERED:none  DRAINS: (11fr) Jackson-Pratt drain(s) with closed bulb suction in the RLQ   LOCAL MEDICATIONS USED:  MARCAINE 20CC  SPECIMEN:  Excision  DISPOSITION OF SPECIMEN:  PATHOLOGY  COUNTS:  YES  TOURNIQUET:  * No tourniquets in log *  DICTATION: .Dragon Dictation The patient was brought to the operating room and identified as the correct patient.  He was placed supine on the operating room table and general anesthesia was induced. His abdomen was prepped and draped in the usual sterile fashion. Using a scalpel, a small vertical incision was made just below the umbilicus.  This was carried down to the fascia which is then opened the scalpel. Hemostat was then used to pass into the peritoneal cavity under direct vision. Next a 0 Vicryl birthing suture was placed around the fascial opening. The [facility] port was placed through the opening and insufflation of the abdomen was begun. A 5 mm port was placed the patient's right upper quadrant and another was placed the patient's left lower quadrant under direct vision. The appendix was found to be retrocecal and stuck on a phlegmon and abscess. I was able to fill the cecum off of the appendix which was quite foreshortened. I was able to aspirate all the purulence  From the right lower quadrant. I was then able to take down the mesoappendix with the harmonic scalpel. I could finally elevate the appendix and identified the base at the cecum. I transected the base of the endoscopic GIA stapler. The appendix was then placed in an Endosac and removed through the  incision at the umbilicus. I then thoroughly irrigated the abdomen with normal saline. I made a small incision in the patient's right lower quadrant and placed a 19 [place] Blake drain through the umbilical port and pulled out through the right lower quadrant incision. I then placed this in the right lower quadrant underneath the cecum. Hemostasis appeared to be achieved. All ports removed under direct vision and the abdomen was deflated. A 0 Vicryl at the umbilicus and tied in place closing the fascial defect. All incisions were anesthetized with Marcaine and closed with 4-0 Monocryl sutures. Steri-Strips and Band-Aids were applied. The patient tolerated the procedure well. All counts were correct at the end of the procedure. The patient was then extubated in the operating room and taken in a stable condition to the recovery room. PLAN OF CARE: Admit to inpatient   PATIENT DISPOSITION:  PACU - hemodynamically stable.   Delay start of Pharmacological VTE agent (>24hrs) due to surgical blood loss or risk of bleeding:  {YES/NO/NOT APPLICABLE:20182

## 2011-02-12 NOTE — Transfer of Care (Signed)
Immediate Anesthesia Transfer of Care Note  Patient: Eric Aguirre  Procedure(s) Performed:  APPENDECTOMY LAPAROSCOPIC - Laparoscopic Appendectomy  Patient Location: PACU  Anesthesia Type: General  Level of Consciousness: awake, alert  and oriented  Airway & Oxygen Therapy: Patient Spontanous Breathing and Patient connected to nasal cannula oxygen  Post-op Assessment: Report given to PACU RN and Post -op Vital signs reviewed and stable  Post vital signs: Reviewed and stable  Complications: No apparent anesthesia complications

## 2011-02-12 NOTE — Preoperative (Signed)
Beta Blockers   Reason not to administer Beta Blockers:Not Applicable 

## 2011-02-12 NOTE — H&P (Signed)
Chief Complaint: Appendicitis HPI: Bridger Pizzi is an 50 y.o. male who is otherwise healthy with exception of gout. He developed some abdominal pain about 4 days while out of town. He returned home and got to his dosctor's office today. A CT was ordered and finds perf appendicitis. He has had some chills/sweats, no doc fever. Denies N/V, but no appetite. BMs normal. No CP, SOB, dysuria. After his PCP contacted [place], we asked to come to the ER for admission.  Past Medical History:  Past Medical History  Diagnosis Date  . Gout 2005    uric acid 9   . Sullivan Lone syndrome     Past Surgical History:  Past Surgical History  Procedure Date  . Vasectomy   . Colonoscopy 2011    negative     Family History:  Family History  Problem Relation Age of Onset  . Diabetes Father   . Hypertension Father     cns aneurysm rupture  . Cancer Paternal Uncle     ? primary  . Arthritis      MGGM  . Thyroid disease Mother     goiter  . Dementia Mother     Social History:  reports that he has never smoked. He does not have any smokeless tobacco history on file. He reports that he drinks about 3.6 ounces of alcohol per week. He reports that he does not use illicit drugs.  Allergies:  Allergies  Allergen Reactions  . RUE:AVWUJWJXBJY+NWGNFAOZH+YQMVHQIONG Acid+Aspartame     REACTION: RASH see OV  12-22-07  . Tussionex Pennkinetic Er     REACTION: RASH see OV note 12-22-07    Medications: Medications Prior to Admission  Medication Dose Route Frequency Provider Last Rate Last Dose  . iohexol (OMNIPAQUE) 300 MG/ML injection 100 mL  100 mL Intravenous Once PRN Medication Radiologist   100 mL at 02/12/11 1341   Medications Prior to Admission  Medication Sig Dispense Refill  . allopurinol (ZYLOPRIM) 300 MG tablet Take 300 mg by mouth daily.        Marland Kitchen aspirin 81 MG tablet Take 81 mg by mouth daily.          Please see HPI, otherwise 10 system reviewed  Blood pressure 120/70, pulse 100-120,  temperature 99.3 F (37.4 C), temperature source Oral, resp. rate 14, SpO2 97.00%. There is no height or weight on file to calculate BMI. @PHYSEXAMBYAGE2 @    General Appearance:  Alert, cooperative,mod distress, appears stated age  Head:  Normocephalic, without obvious abnormality, atraumatic  ENT: Unremarkable  Neck: Supple, symmetrical, trachea midline, no adenopathy, thyroid: not enlarged, symmetric, no tenderness/mass/nodules  Lungs:   Clear to auscultation bilaterally, no w/r/r, respirations unlabored without use of accessory muscles.  Chest Wall:  No tenderness or deformity  Heart:  Regular rate and rhythm, S1, S2 normal, no murmur, rub or gallop. Carotids 2+ without bruit.  Abdomen:   Soft, point tender RLQ with rebound and gaurding. No masses or hernias.  Genitalia:  Normal. No hernias  Rectal:  Deferred.  Extremities: Extremities normal, atraumatic, no cyanosis or edema  Pulses: 2+ and symmetric  Skin: Skin color, texture, turgor normal, no rashes or lesions  Neurologic: Normal affect, no gross deficits.    Ct Abdomen Pelvis W Wo Contrast  02/12/2011  *RADIOLOGY REPORT*  Clinical Data: 50 year old male with abdominal and pelvic pain and nausea.  CT ABDOMEN AND PELVIS WITHOUT AND WITH CONTRAST  Technique:  Multidetector CT imaging of the abdomen and pelvis was performed without contrast  material in one or both body regions, followed by contrast material(s) and further sections in one or both body regions.  Contrast: OMNIPAQUE IOHEXOL 300 MG/ML IV SOLN  Comparison: None  Findings: The appendix is enlarged with thickened walls and moderate adjacent inflammation compatible with appendicitis.  A small amount of free fluid adjacent to the appendix and within the right pericolic gutter likely represents perforation. There is no evidence of bowel obstruction or pneumoperitoneum.  Mildly enlarged right mesenteric lymph nodes are likely reactive.  The liver, pancreas, adrenal glands,  gallbladder and kidneys are unremarkable. The spleen is upper limits of normal in size without focal splenic lesions.  There is no evidence of biliary dilatation or abdominal aortic aneurysm. No other bowel abnormalities are identified. The bladder is unremarkable.  A small amount of free fluid in the pelvis is noted.  No acute or suspicious bony abnormalities are noted.  IMPRESSION: Appendicitis with findings likely representing perforation.  No evidence of pneumoperitoneum or bowel obstruction.  Upper limits normal spleen size.  These results were called to Dr. Dierdre Forth on 02/12/2011 at 1:50 p.m.  Original Report Authenticated By: Rosendo Gros, M.D.    Assessment/Plan Principal Problem:  *Acute perforated appendicitis  Admit, To OR for urgent appendectomy. I discussed the procedure in detail.  The patient was given Agricultural engineer.  We discussed the risks and benefits of a laparoscopic appendectomy and possible, but not limited to bleeding, infection, injury to surrounding structures such as the intestine or bladder, need to convert to an open procedure,ileus, blood clots such as  DVT, anesthesia risks, and possible need for additional procedures.  The likelihood of improvement in symptoms and return to the patient's normal status is good. We discussed the typical post-operative recovery course.   Marianna Fuss 02/12/2011, 3:58 PM

## 2011-02-12 NOTE — ED Notes (Signed)
Patient presents with RLQ abdominal pain since last Thursday with worsening pain Saturday. Patient was sent by Dr. Alwyn Ren for acute appendicitis.

## 2011-02-12 NOTE — ED Notes (Signed)
Pt to OR at 1610.

## 2011-02-12 NOTE — Telephone Encounter (Signed)
Because of history of allergy to testing next (rash); I would recommend meloxicam 7.5 mg every 8-12 hours. Dispense 20.

## 2011-02-12 NOTE — Anesthesia Preprocedure Evaluation (Addendum)
Anesthesia Evaluation  Patient identified by MRN, date of birth, ID band Patient awake    Reviewed: Allergy & Precautions, NPO status , Patient's Chart, lab work & pertinent test results, reviewed documented beta blocker date and time   Airway Mallampati: I  Neck ROM: Full    Dental  (+) Teeth Intact and Dental Advisory Given   Pulmonary neg pulmonary ROS,  clear to auscultation        Cardiovascular neg cardio ROS Regular Normal    Neuro/Psych    GI/Hepatic Neg liver ROS,   Endo/Other    Renal/GU negative Renal ROS     Musculoskeletal   Abdominal   Peds  Hematology   Anesthesia Other Findings gout  Reproductive/Obstetrics                          Anesthesia Physical Anesthesia Plan  ASA: II and Emergent  Anesthesia Plan: General   Post-op Pain Management:    Induction: Intravenous  Airway Management Planned: Oral ETT  Additional Equipment:   Intra-op Plan:   Post-operative Plan: Extubation in OR  Informed Consent: I have reviewed the patients History and Physical, chart, labs and discussed the procedure including the risks, benefits and alternatives for the proposed anesthesia with the patient or authorized representative who has indicated his/her understanding and acceptance.     Plan Discussed with: CRNA and Surgeon  Anesthesia Plan Comments:        Anesthesia Quick Evaluation

## 2011-02-13 LAB — BASIC METABOLIC PANEL
CO2: 30 mEq/L (ref 19–32)
Calcium: 9.6 mg/dL (ref 8.4–10.5)
Creatinine, Ser: 1.02 mg/dL (ref 0.50–1.35)
GFR calc non Af Amer: 83 mL/min — ABNORMAL LOW (ref >90)

## 2011-02-13 LAB — CBC
MCH: 29.9 pg (ref 26.0–34.0)
MCV: 86.5 fL (ref 78.0–100.0)
Platelets: 194 10*3/uL (ref 150–400)
RDW: 13 % (ref 11.5–15.5)
WBC: 19.7 10*3/uL — ABNORMAL HIGH (ref 4.0–10.5)

## 2011-02-13 MED ORDER — MORPHINE SULFATE 2 MG/ML IJ SOLN: 1.0000 mg | INTRAMUSCULAR | Status: DC | PRN

## 2011-02-13 MED ORDER — KCL IN DEXTROSE-NACL 20-5-0.45 MEQ/L-%-% IV SOLN
INTRAVENOUS | Status: DC
  Filled 2011-02-13 (×4): qty 1000

## 2011-02-13 MED ORDER — PANTOPRAZOLE SODIUM 40 MG IV SOLR
40.0000 mg | Freq: Every day | INTRAVENOUS | Status: DC
  Administered 2011-02-13: 40 mg via INTRAVENOUS
  Filled 2011-02-13 (×2): qty 40

## 2011-02-13 MED ORDER — DIPHENHYDRAMINE HCL 12.5 MG/5ML PO ELIX
12.5000 mg | ORAL_SOLUTION | Freq: Four times a day (QID) | ORAL | Status: DC | PRN
  Filled 2011-02-13: qty 10

## 2011-02-13 MED ORDER — DIPHENHYDRAMINE HCL 50 MG/ML IJ SOLN: 12.5000 mg | Freq: Four times a day (QID) | INTRAMUSCULAR | Status: DC | PRN

## 2011-02-13 MED ORDER — PROMETHAZINE HCL 25 MG/ML IJ SOLN: 12.5000 mg | Freq: Four times a day (QID) | INTRAMUSCULAR | Status: DC | PRN

## 2011-02-13 MED ORDER — INFLUENZA VIRUS VACC SPLIT PF IM SUSP
0.5000 mL | INTRAMUSCULAR | Status: AC
  Administered 2011-02-14: 0.5 mL via INTRAMUSCULAR
  Filled 2011-02-13: qty 0.5

## 2011-02-13 NOTE — Progress Notes (Signed)
I have seen and examined the patient and agree with the assessment and plans  

## 2011-02-13 NOTE — Progress Notes (Signed)
1 Day Post-Op  Subjective: Pt feels much better this morning.  Pain is much less.  No flatus yet.  Voiding well.  Objective: Vital signs in last 24 hours: Temp:  [98.1 F (36.7 C)-99.3 F (37.4 C)] 98.7 F (37.1 C) (11/13 0540) Pulse Rate:  [61-104] 80  (11/13 0540) Resp:  [8-17] 16  (11/13 0540) BP: (96-135)/(57-70) 97/59 mmHg (11/13 0540) SpO2:  [97 %-100 %] 99 % (11/13 0540) Weight:  [168 lb (76.204 kg)] 168 lb (76.204 kg) (11/12 1930) Last BM Date: 02/11/11  Intake/Output from previous day: 11/12 0701 - 11/13 0700 In: 2351.2 [I.V.:2251.2; IV Piggyback:100] Out: 1645 [Urine:1475; Drains:170] Intake/Output this shift:    PE: Abd: soft, tender, JP with some cloudy serosanguinous output.  ND, few BS.  Incisions c/d/i HT: regular Lungs: CTAB  Lab Results:   Basename 02/13/11 0555 02/12/11 2038  WBC 19.7* 19.0*  HGB 15.9 15.2  HCT 46.0 42.7  PLT 194 160   BMET  Basename 02/13/11 0555 02/12/11 2038  NA 140 --  K 3.8 --  CL 101 --  CO2 30 --  GLUCOSE 125* --  BUN 8 --  CREATININE 1.02 0.93  CALCIUM 9.6 --   PT/INR No results found for this basename: LABPROT:2,INR:2 in the last 72 hours   Studies/Results: @RISRSLT2 @  Anti-infectives: Anti-infectives     Start     Dose/Rate Route Frequency Ordered Stop   02/12/11 2030   ciprofloxacin (CIPRO) IVPB 400 mg        400 mg 200 mL/hr over 60 Minutes Intravenous Every 12 hours 02/12/11 2021     02/12/11 2030   metroNIDAZOLE (FLAGYL) IVPB 500 mg        500 mg 100 mL/hr over 60 Minutes Intravenous Every 8 hours 02/12/11 2021     02/12/11 1630   ciprofloxacin (CIPRO) IVPB 400 mg  Status:  Discontinued        400 mg 200 mL/hr over 60 Minutes Intravenous Every 12 hours 02/12/11 1628 02/12/11 1720   02/12/11 1630   metroNIDAZOLE (FLAGYL) IVPB 500 mg  Status:  Discontinued        500 mg 100 mL/hr over 60 Minutes Intravenous Every 8 hours 02/12/11 1628 02/12/11 2057           Assessment/Plan  1.perforated  appendicitis, s/p lap appy  Plan: allow regular diet and po pain meds WBCs still 19.7K.  Cont Cipro and Flagyl Start ambulation and pulm toilet Recheck labs in the morning Cont JP drain   LOS: 1 day    Westyn Keatley E 02/13/2011

## 2011-02-13 NOTE — Progress Notes (Signed)
Pt. Arrived on unit alert and oriented. VSS. Oriented to room and call bell. Will continue to monitor.

## 2011-02-14 LAB — CBC
Hemoglobin: 13 g/dL (ref 13.0–17.0)
MCHC: 34.5 g/dL (ref 30.0–36.0)
RDW: 12.7 % (ref 11.5–15.5)
WBC: 11.7 10*3/uL — ABNORMAL HIGH (ref 4.0–10.5)

## 2011-02-14 LAB — BASIC METABOLIC PANEL
GFR calc Af Amer: >90 mL/min (ref >90)
GFR calc non Af Amer: >90 mL/min (ref >90)
Potassium: 3.5 mEq/L (ref 3.5–5.1)
Sodium: 135 mEq/L (ref 135–145)

## 2011-02-14 MED ORDER — HYDROCODONE-ACETAMINOPHEN 5-325 MG PO TABS: 1.0000 | ORAL_TABLET | ORAL | Status: AC | PRN

## 2011-02-14 MED ORDER — METRONIDAZOLE 500 MG PO TABS: 500.0000 mg | ORAL_TABLET | Freq: Three times a day (TID) | ORAL | Status: AC

## 2011-02-14 MED ORDER — CIPROFLOXACIN HCL 500 MG PO TABS: 500.0000 mg | ORAL_TABLET | Freq: Two times a day (BID) | ORAL | Status: AC

## 2011-02-14 NOTE — Progress Notes (Signed)
I have seen and examined the patient and agree with the assessment and plans  

## 2011-02-14 NOTE — Discharge Summary (Signed)
I have seen and examined the patient and agree with the assessment and plans  

## 2011-02-14 NOTE — Progress Notes (Signed)
  2 Days Post-Op  Subjective: Pt feels well.  No complaints.  Tolerating regular diet. +flatus, no BM yet  Objective: Vital signs in last 24 hours: Temp:  [97.8 F (36.6 C)-98.3 F (36.8 C)] 98 F (36.7 C) (11/14 0600) Pulse Rate:  [82-92] 87  (11/14 0600) Resp:  [16-20] 19  (11/14 0600) BP: (100-122)/(58-63) 122/62 mmHg (11/14 0600) SpO2:  [95 %-99 %] 99 % (11/14 0600) Last BM Date: 02/11/11  Intake/Output from previous day: 11/13 0701 - 11/14 0700 In: 1070 [P.O.:600; I.V.:470] Out: 2500 [Urine:2450; Drains:50] Intake/Output this shift:    PE: Abd: soft, +BS, ND, appropriately tender.  JP with serosang output.  Lab Results:   [facility] 02/13/11 0555 02/12/11 2038  WBC 19.7* 19.0*  HGB 15.9 15.2  HCT 46.0 42.7  PLT 194 160   BMET  Basename 02/13/11 0555 02/12/11 2038  NA 140 --  K 3.8 --  CL 101 --  CO2 30 --  GLUCOSE 125* --  BUN 8 --  CREATININE 1.02 0.93  CALCIUM 9.6 --   PT/INR No results found for this basename: LABPROT:2,INR:2 in the last 72 hours   Studies/Results: @RISRSLT2 @  Anti-infectives: Anti-infectives     Start     Dose/Rate Route Frequency Ordered Stop   02/12/11 2030   ciprofloxacin (CIPRO) IVPB 400 mg        400 mg 200 mL/hr over 60 Minutes Intravenous Every 12 hours 02/12/11 2021     02/12/11 2030   metroNIDAZOLE (FLAGYL) IVPB 500 mg        500 mg 100 mL/hr over 60 Minutes Intravenous Every 8 hours 02/12/11 2021     02/12/11 1630   ciprofloxacin (CIPRO) IVPB 400 mg  Status:  Discontinued        400 mg 200 mL/hr over 60 Minutes Intravenous Every 12 hours 02/12/11 1628 02/12/11 1720   02/12/11 1630   metroNIDAZOLE (FLAGYL) IVPB 500 mg  Status:  Discontinued        500 mg 100 mL/hr over 60 Minutes Intravenous Every 8 hours 02/12/11 1628 02/12/11 2057           Assessment/Plan  1. S/p lap appy for perf appy 2. Labs Pending  Plan: If WBCs improved today, pt may be able to go home with po abx for 7-10 days.  He's  tolerating a regular diet and otherwise doing well.  Will follow-up after labs back.   LOS: 2 days    Eara Burruel E 02/14/2011

## 2011-02-14 NOTE — Discharge Summary (Signed)
Patient ID: Eric Aguirre MRN: [id number] DOB/AGE: Aug 16, 1960 50 y.o.  Admit date: 02/12/2011 Discharge date: 02/14/2011  Procedures: lap appy by Dr. Magnus Ivan on 02-12-11  Consults: none  Reason for Admission: acute appendicitis, perforated  Admission Diagnoses: 1. Acute perforated appendicitis 2. gout  Hospital Course: The patient was admitted emergently taken to the operating room where he underwent a laparoscopic appendectomy. The patient was found to have a perforated appendix. A 19 [place] Blake drain was left in place. The patient tolerated the procedure well. On postoperative day 1 his diet was advanced as tolerated. His JP drain was putting out minimal serous sanguinous drainage. His white blood cell count remained elevated at 19,000. Therefore, he was left here on IV antibiotics. By postoperative day 2, his white blood cell count had decreased to 11,000. He was tolerating a regular diet and passing flatus. At this time he was felt stable for discharge home with his JP drain in place. We will teach him prior to discharge how to manage her JP drain.  Discharge Diagnoses:  Principal Problem:  *Acute perforated appendicitis  Patient Active Problem List  Diagnoses  . GOUT  . GILBERT'S SYNDROME  . Lumbago  . SLEEP DISORDER  . UNS ADVRS EFF UNS RX MEDICINAL&BIOLOGICAL SBSTNC  . Abdominal cramping  . Acute perforated appendicitis     Discharge Medications: Current Discharge Medication List    START taking these medications   Details  ciprofloxacin (CIPRO) 500 MG tablet Take 1 tablet (500 mg total) by mouth 2 (two) times daily. Qty: 20 tablet, Refills: 1    HYDROcodone-acetaminophen (NORCO) 5-325 MG per tablet Take 1-2 tablets by mouth every 4 (four) hours as needed. Qty: 30 tablet, Refills: 0    metroNIDAZOLE (FLAGYL) 500 MG tablet Take 1 tablet (500 mg total) by mouth 3 (three) times daily. Qty: 30 tablet, Refills: 1      CONTINUE these medications which have NOT  CHANGED   Details  allopurinol (ZYLOPRIM) 300 MG tablet Take 300 mg by mouth daily.     Associated Diagnoses: Gout, unspecified    aspirin 81 MG tablet Take 81 mg by mouth daily.     Associated Diagnoses: Other abnormal glucose        Discharge Instructions:  Follow-up Information    Follow up with Shelly Rubenstein, MD on 02/20/2011. (at 10:00am for 10:20 appt)    Contact information:   [facility] [address], [address] [place] [address] [phone]        [facility] ______[facility] LAPAROSCOPIC SURGERY: POST OP INSTRUCTIONS Always review your discharge instruction sheet given to you by the facility where your surgery was performed. IF YOU HAVE DISABILITY OR FAMILY LEAVE FORMS, YOU MUST BRING THEM TO THE OFFICE FOR PROCESSING.   DO NOT GIVE THEM TO YOUR DOCTOR.  1. A prescription for pain medication may be given to you upon discharge.  Take your pain medication as prescribed, if needed.  If narcotic pain medicine is not needed, then you may take acetaminophen (Tylenol) or ibuprofen (Advil) as needed. 2. Take your usually prescribed medications unless otherwise directed. 3. If you need a refill on your pain medication, please contact your pharmacy.  They will contact our office to request authorization. Prescriptions will not be filled after 5pm or on week-ends. 4. You should follow a light diet the first few days after arrival home, such as soup and crackers, etc.  Be sure to include lots of fluids daily. 5.  Most patients will experience some swelling and bruising in the area of the incisions.  Ice packs will help.  Swelling and bruising can take several days to resolve.  6. It is common to experience some constipation if taking pain medication after surgery.  Increasing fluid intake and taking a stool softener (such as Colace) will usually help or prevent this problem from occurring.  A mild laxative (Milk of Magnesia or  Miralax) should be taken according to package instructions if there are no bowel movements after 48 hours. 7. Unless discharge instructions indicate otherwise, you may remove your bandages 24-48 hours after surgery, and you may shower at that time.  You may have steri-strips (small skin tapes) in place directly over the incision.  These strips should be left on the skin for 7-10 days.  If your surgeon used skin glue on the incision, you may shower in 24 hours.  The glue will flake off over the next 2-3 weeks.  Any sutures or staples will be removed at the office during your follow-up visit. 8. ACTIVITIES:  You may resume regular (light) daily activities beginning the next day-such as daily self-care, walking, climbing stairs-gradually increasing activities as tolerated.  You may have sexual intercourse when it is comfortable.  Refrain from any heavy lifting or straining until approved by your doctor. a. You may drive when you are no longer taking prescription pain medication, you can comfortably wear a seatbelt, and you can safely maneuver your car and apply brakes. b. RETURN TO WORK:  __________________________________________________________ 9. You should see your doctor in the office for a follow-up appointment approximately 2-3 weeks after your surgery.  Make sure that you call for this appointment within a day or two after you arrive home to insure a convenient appointment time. 10. OTHER INSTRUCTIONS: __________________________________________________________________________________________________________________________ __________________________________________________________________________________________________________________________ WHEN TO CALL YOUR DOCTOR: 1. Fever over 101.0 2. Inability to urinate 3. Continued bleeding from incision. 4. Increased pain, redness, or drainage from the incision. 5. Increasing abdominal pain  The clinic staff is available to answer your questions during  regular business hours.  Please don't hesitate to call and ask to speak to one of the nurses for clinical concerns.  If you have a medical emergency, go to the nearest emergency room or call 911.  A surgeon from [facility] is always on call at the hospital. [address], [place], [place]  [address] ? [address] [address], [place], [place]   [address] [phone] ? [phone] ? FAX [phone] Web site: [email or web address]   Signed: Mahogony Gilchrest E 02/14/2011, 9:03 AM

## 2011-02-15 ENCOUNTER — Encounter (INDEPENDENT_AMBULATORY_CARE_PROVIDER_SITE_OTHER): Payer: Self-pay | Admitting: Surgery

## 2011-02-16 ENCOUNTER — Encounter (HOSPITAL_COMMUNITY): Payer: Self-pay | Admitting: Surgery

## 2011-02-16 NOTE — Telephone Encounter (Signed)
Left message to call office

## 2011-02-19 NOTE — Telephone Encounter (Signed)
Left message to call office

## 2011-02-20 ENCOUNTER — Encounter (INDEPENDENT_AMBULATORY_CARE_PROVIDER_SITE_OTHER): Payer: Self-pay | Admitting: Surgery

## 2011-02-20 ENCOUNTER — Ambulatory Visit (INDEPENDENT_AMBULATORY_CARE_PROVIDER_SITE_OTHER): Payer: 59 | Admitting: Surgery

## 2011-02-20 VITALS — Resp 16 | Ht 69.0 in | Wt 164.4 lb

## 2011-02-20 DIAGNOSIS — Z09 Encounter for follow-up examination after completed treatment for conditions other than malignant neoplasm: Secondary | ICD-10-CM

## 2011-02-20 NOTE — Telephone Encounter (Signed)
Pt return call, Pt states that he had his surgery and is all taking care of and thank [place] for all our help.Marland Kitchen

## 2011-02-20 NOTE — Progress Notes (Signed)
Subjective:     Patient ID: Eric Aguirre, male   DOB: March 25, 1961, 50 y.o.   MRN: [id number]  HPI He is here for his first postoperative visit status post laparoscopic appendectomy performed on November 12 for perforated appendicitis. He is here today for drain removal. He is doing moderately well. He has had no fevers. Her drain is draining minimal fluid  Review of Systems     Objective:   Physical Exam    On exam, the drainage is serosanguineous. His incisions are healing well. I removed the drain. Assessment:     Patient status post laparoscopic appendectomy for perforated appendicitis    Plan:     He will refrain from heavy lifting for one more week. He will continue his antibiotics. I gave her noted to return to work on December 3.  He will call should he develop any fevers

## 2011-03-20 ENCOUNTER — Telehealth (INDEPENDENT_AMBULATORY_CARE_PROVIDER_SITE_OTHER): Payer: Self-pay

## 2011-03-20 NOTE — Telephone Encounter (Signed)
C/o  knot near IV site of left arm. .  Patient stated it started to swell in the past week.  Patient advised to apply warm moist heat for the next 24 hrs. If  problem worsens or does not improve call back for office visit.

## 2011-04-08 ENCOUNTER — Other Ambulatory Visit: Payer: Self-pay | Admitting: Internal Medicine

## 2011-04-20 ENCOUNTER — Ambulatory Visit (INDEPENDENT_AMBULATORY_CARE_PROVIDER_SITE_OTHER): Payer: 59 | Admitting: Family Medicine

## 2011-04-20 VITALS — BP 130/80 | HR 100 | Temp 97.7°F | Ht 69.0 in | Wt 169.2 lb

## 2011-04-20 DIAGNOSIS — R1031 Right lower quadrant pain: Secondary | ICD-10-CM | POA: Insufficient documentation

## 2011-04-20 LAB — CBC WITH DIFFERENTIAL/PLATELET
Basophils Absolute: 0 10*3/uL (ref 0.0–0.1)
HCT: 47.8 % (ref 39.0–52.0)
Hemoglobin: 16.4 g/dL (ref 13.0–17.0)
Lymphs Abs: 2.6 10*3/uL (ref 0.7–4.0)
MCHC: 34.3 g/dL (ref 30.0–36.0)
MCV: 88.1 fl (ref 78.0–100.0)
Monocytes Absolute: 0.6 10*3/uL (ref 0.1–1.0)
Monocytes Relative: 7.2 % (ref 3.0–12.0)
Neutro Abs: 4.4 10*3/uL (ref 1.4–7.7)
Platelets: 219 10*3/uL (ref 150.0–400.0)
RDW: 13.8 % (ref 11.5–14.6)

## 2011-04-20 NOTE — Assessment & Plan Note (Signed)
Pt w/out pain on exam today.  Will check CBC to r/o possible infection.  More likely the pain was due to gas or possible fecolith.  Reassurance provided.

## 2011-04-20 NOTE — Progress Notes (Signed)
  Subjective:    Patient ID: Eric Aguirre, male    DOB: November 28, 1960, 51 y.o.   MRN: [id number]  HPI RLQ pain- sxs started intermittantly 2 days ago, 24 hrs ago became 'constant pain'.  Had emergency appe in November w/ ruptured appendix.  Pain is at site of previous drain.  No nausea.  Denies constipation, diarrhea.  No fevers.  Had wife press on area when lying flat and this elicited knee jerk reaction.  Pain described as a 'dull consistent' pain.  Surgeon was Dr Rayburn Ma.   Review of Systems For ROS see HPI     Objective:   Physical Exam  Vitals reviewed. Constitutional: He appears well-developed and well-nourished. No distress.  Abdominal: Soft. Bowel sounds are normal. He exhibits no distension and no mass. There is no tenderness. There is no rebound and no guarding.       Well healed RLQ abdominal incisions          Assessment & Plan:

## 2011-04-20 NOTE — Patient Instructions (Signed)
This does not appear to be a residual abscess We'll let you know about blood count Tylenol or ibuprofen for pain relief This could have been gas pain or a fecolith (poop stone) If you have worsening pain, fever, or other concerns- please call or go to the ER Hang in there!!!

## 2011-09-07 ENCOUNTER — Encounter: Payer: Self-pay | Admitting: Internal Medicine

## 2011-10-17 ENCOUNTER — Other Ambulatory Visit: Payer: Self-pay | Admitting: *Deleted

## 2011-10-17 DIAGNOSIS — M109 Gout, unspecified: Secondary | ICD-10-CM

## 2011-10-17 NOTE — Telephone Encounter (Signed)
Pt would like to know if Dr Alwyn Ren can take over filling his allopurinol (ZYLOPRIM) 300 MG tablet . Pt indicated that this was previously filled by Dr Norwood Levo.Please advise

## 2011-10-17 NOTE — Telephone Encounter (Signed)
#  90,R X 1 

## 2011-10-17 NOTE — Telephone Encounter (Signed)
Left message to call office, to confirm pharmacy.

## 2011-10-18 MED ORDER — ALLOPURINOL 300 MG PO TABS: 300.0000 mg | ORAL_TABLET | Freq: Every day | ORAL | Status: DC

## 2011-10-18 NOTE — Telephone Encounter (Signed)
Pt called back and confirmed pharmacy on file. Left Pt VM Rx sent.

## 2012-06-11 ENCOUNTER — Encounter: Payer: Self-pay | Admitting: Internal Medicine

## 2012-06-11 ENCOUNTER — Ambulatory Visit (INDEPENDENT_AMBULATORY_CARE_PROVIDER_SITE_OTHER): Payer: Managed Care, Other (non HMO) | Admitting: Internal Medicine

## 2012-06-11 VITALS — BP 124/86 | HR 72 | Wt 182.0 lb

## 2012-06-11 DIAGNOSIS — K219 Gastro-esophageal reflux disease without esophagitis: Secondary | ICD-10-CM

## 2012-06-11 MED ORDER — DEXLANSOPRAZOLE 60 MG PO CPDR: 60.0000 mg | DELAYED_RELEASE_CAPSULE | Freq: Every day | ORAL | Status: DC

## 2012-06-11 NOTE — Patient Instructions (Addendum)
Reflux of gastric acid may be asymptomatic as this may occur mainly during sleep.The triggers for reflux  include stress; the "aspirin family" ; alcohol; peppermint; and caffeine (coffee, tea, cola, and chocolate). The aspirin family would include aspirin and the nonsteroidal agents such as ibuprofen &  Naproxen. Tylenol would not cause reflux. If having symptoms ; food & drink should be avoided for @ least 2 hours before going to bed.  

## 2012-06-11 NOTE — Progress Notes (Signed)
  Subjective:    Patient ID: Eric Aguirre, male    DOB: 02/26/1961, 52 y.o.   MRN: [id number]  HPI  Symptoms began in Oct. 2013 with heartburn in the epigastric area and belching.  Patient feels coffee, spicy foods, and hot sauces exacerbate symptoms. Pt was seen by another physician in the community and started of pantoprazole 40 mg daily with partial effectiveness. He discontinued taking medication the end of Jan. 2014 and symptoms have worsened.  TUMS have also provided partial relief.       Review of Systems No nausea, vomiting, or diarrhea. No chest pain, dizziness, or syncope. No changes in bowels. Denies food hanging up or sticking after meals.     Objective:   Physical Exam General appearance :thin but  good health and nourishment w/o distress.  Eyes: No conjunctival inflammation or scleral icterus is present.  Oral exam: Dental hygiene is good; lips and gums are healthy appearing.There is no oropharyngeal erythema or exudate noted.   Heart:  Normal rate and regular rhythm. S1 and S2 normal without gallop, murmur, click, rub or other extra sounds .S4 with slurring at  LSB   Lungs:Chest clear to auscultation; no wheezes, rhonchi,rales ,or rubs present.No increased work of breathing.   Abdomen: bowel sounds normal, soft and non-tender without masses, organomegaly or hernias noted.  No guarding or rebound   Skin:Warm & dry.  Intact without suspicious lesions or rashes ; no jaundice or tenting  Lymphatic: No lymphadenopathy is noted about the head, neck, axilla            Assessment & Plan:  #1 GERD Plan: See orders and recommendations

## 2012-06-12 ENCOUNTER — Encounter: Payer: Self-pay | Admitting: Gastroenterology

## 2012-06-16 ENCOUNTER — Other Ambulatory Visit: Payer: Self-pay | Admitting: Internal Medicine

## 2012-06-16 DIAGNOSIS — M109 Gout, unspecified: Secondary | ICD-10-CM

## 2012-06-16 DIAGNOSIS — T887XXA Unspecified adverse effect of drug or medicament, initial encounter: Secondary | ICD-10-CM

## 2012-06-16 NOTE — Telephone Encounter (Signed)
Refill #90; . Please  schedule follow up Labs  : BMET,uric acidDiagnoses /Codes: 274.9,995.20.

## 2012-06-16 NOTE — Telephone Encounter (Signed)
Last ov 06/11/12, last filled 10/17/11 #90 1

## 2012-06-16 NOTE — Telephone Encounter (Signed)
Rx sent, Left message to call office. 

## 2012-06-17 NOTE — Telephone Encounter (Signed)
Future orders placed for GJ, patient will need to schedule lab appointment once he calls back

## 2012-06-19 ENCOUNTER — Other Ambulatory Visit (INDEPENDENT_AMBULATORY_CARE_PROVIDER_SITE_OTHER): Payer: BC Managed Care – PPO

## 2012-06-19 DIAGNOSIS — T887XXA Unspecified adverse effect of drug or medicament, initial encounter: Secondary | ICD-10-CM

## 2012-06-19 DIAGNOSIS — M109 Gout, unspecified: Secondary | ICD-10-CM

## 2012-06-19 LAB — BASIC METABOLIC PANEL
CO2: 28 mEq/L (ref 19–32)
Calcium: 9.3 mg/dL (ref 8.4–10.5)
Creatinine, Ser: 1.1 mg/dL (ref 0.4–1.5)
GFR: 76.86 mL/min (ref >60.00)
Sodium: 137 mEq/L (ref 135–145)

## 2012-06-25 ENCOUNTER — Encounter: Payer: Self-pay | Admitting: *Deleted

## 2012-07-01 ENCOUNTER — Ambulatory Visit (INDEPENDENT_AMBULATORY_CARE_PROVIDER_SITE_OTHER): Payer: BC Managed Care – PPO | Admitting: Gastroenterology

## 2012-07-01 ENCOUNTER — Encounter: Payer: Self-pay | Admitting: Gastroenterology

## 2012-07-01 VITALS — BP 116/80 | HR 80 | Ht 69.0 in | Wt 179.8 lb

## 2012-07-01 DIAGNOSIS — K219 Gastro-esophageal reflux disease without esophagitis: Secondary | ICD-10-CM

## 2012-07-01 NOTE — Progress Notes (Signed)
History of Present Illness:  This is a 52 year old Caucasian male who has had several years of acid reflux mostly nocturnal.  Over the last 6 months his symptoms of regurgitation substernal chest pain has intensified to a daily occurrence.  Since being placed on Dexilant 60 mg a day he is asymptomatic.  He has not had previous endoscopy or barium swallows.  He denies Raynaud's phenomenon or other symptoms of collagen vascular disease, dysphagia, or any hepatobiliary or lower gastrointestinal problems.  His appetite is good and his weight is stable.  Colonoscopy several years ago was unremarkable except for mild diverticulosis coli.  I have reviewed this patient's present history, medical and surgical past history, allergies and medications.     ROS:   All systems were reviewed and are negative unless otherwise stated in the HPI.    Physical Exam: At pressure 116/80, pulse 80 and regular, weight 179 and BMI 26.54. General well developed well nourished patient in no acute distress, appearing their stated age Eyes PERRLA, no icterus, fundoscopic exam per opthamologist Skin no lesions noted Neck supple, no adenopathy, no thyroid enlargement, no tenderness Chest clear to percussion and auscultation Heart no significant murmurs, gallops or rubs noted Abdomen no hepatosplenomegaly masses or tenderness, BS normal.  Extremities no acute joint lesions, edema, phlebitis or evidence of cellulitis. Neurologic patient oriented x 3, cranial nerves intact, no focal neurologic deficits noted. Psychological mental status normal and normal affect.  Assessment and plan: Daily nocturnal acid reflux in a middle-aged Caucasian male, rule out Barrett's mucosa.  I have scheduled him for endoscopy, exam and reviewed her reflux regime in detail with continuation of Dexilant 60 mg a day.  He saw her patient education video on acid reflux and is management.  No diagnosis found.

## 2012-07-01 NOTE — Patient Instructions (Addendum)
It has been recommended to you by your physician that you have a(n) Upper Endoscopy completed. Per your request, we did not schedule the procedure(s) today. Please contact our office at [phone] should you decide to have the procedure completed.  You watched a video today on acid reflux and information is provided below. ____________________________________________________________________________________________________________________________________________________________________________________________  Diet for Gastroesophageal Reflux Disease, Adult Reflux (acid reflux) is when acid from your stomach flows up into the esophagus. When acid comes in contact with the esophagus, the acid causes irritation and soreness (inflammation) in the esophagus. When reflux happens often or so severely that it causes damage to the esophagus, it is called gastroesophageal reflux disease (GERD). Nutrition therapy can help ease the discomfort of GERD. FOODS OR DRINKS TO AVOID OR LIMIT  Smoking or chewing tobacco. Nicotine is one of the most potent stimulants to acid production in the gastrointestinal tract.  Caffeinated and decaffeinated coffee and black tea.  Regular or low-calorie carbonated beverages or energy drinks (caffeine-free carbonated beverages are allowed).   Strong spices, such as black pepper, white pepper, red pepper, cayenne, curry powder, and chili powder.  Peppermint or spearmint.  Chocolate.  High-fat foods, including meats and fried foods. Extra added fats including oils, butter, salad dressings, and nuts. Limit these to less than 8 tsp per day.  Fruits and vegetables if they are not tolerated, such as citrus fruits or tomatoes.  Alcohol.  Any food that seems to aggravate your condition. If you have questions regarding your diet, call your caregiver or a registered dietitian. OTHER THINGS THAT MAY HELP GERD INCLUDE:   Eating your meals slowly, in a relaxed setting.  Eating  5 to 6 small meals per day instead of 3 large meals.  Eliminating food for a period of time if it causes distress.  Not lying down until 3 hours after eating a meal.  Keeping the head of your bed raised 6 to 9 inches (15 to 23 cm) by using a foam wedge or blocks under the legs of the bed. Lying flat may make symptoms worse.  Being physically active. Weight loss may be helpful in reducing reflux in overweight or obese adults.  Wear loose fitting clothing EXAMPLE MEAL PLAN This meal plan is approximately 2,000 calories based on https://www.[facility].org/ meal planning guidelines. Breakfast   cup cooked oatmeal.  1 cup strawberries.  1 cup low-fat milk.  1 oz almonds. Snack  1 cup cucumber slices.  6 oz yogurt (made from low-fat or fat-free milk). Lunch  2 slice whole-wheat bread.  2 oz sliced [place].  2 tsp mayonnaise.  1 cup blueberries.  1 cup snap peas. Snack  6 whole-wheat crackers.  1 oz string cheese. Dinner   cup brown rice.  1 cup mixed veggies.  1 tsp olive oil.  3 oz grilled fish. Document Released: 03/19/2005 Document Revised: 06/11/2011 Document Reviewed: 02/02/2011 ExitCare Patient Information 2013 [place] [employer]. ________________________________________________________________________________________________________________                                               We are excited to introduce MyChart, a new best-in-class service that provides you online access to important information in your electronic medical record. We want to make it easier for you to view your health information - all in one secure location - when and where you need it. We expect MyChart will enhance the quality  of care and service we provide.  When you register for MyChart, you can:    View your test results.    Request appointments and receive appointment reminders via email.    Request medication renewals.    View your medical history, allergies, medications and  immunizations.    Communicate with your physician's office through a password-protected site.    Conveniently print information such as your medication lists.  To find out if MyChart is right for you, please talk to a member of our clinical staff today. We will gladly answer your questions about this free health and wellness tool.  If you are age 13 or older and want a member of your family to have access to your record, you must provide written consent by completing a proxy form available at our office. Please speak to our clinical staff about guidelines regarding accounts for patients younger than age 53.  As you activate your MyChart account and need any technical assistance, please call the MyChart technical support line at [phone]) or email your question to [email or web address]. If you email your question(s), please include your name, a return phone number and the best time to reach you.  If you have non-urgent health-related questions, you can send a message to our office through MyChart at [email or web address]. If you have a medical emergency, call 911.  Thank you for using MyChart as your new health and wellness resource!   MyChart licensed from [employer],  [id number]. Patents Pending.

## 2012-07-02 ENCOUNTER — Other Ambulatory Visit: Payer: Self-pay | Admitting: *Deleted

## 2012-07-02 DIAGNOSIS — K219 Gastro-esophageal reflux disease without esophagitis: Secondary | ICD-10-CM

## 2012-07-02 MED ORDER — DEXLANSOPRAZOLE 60 MG PO CPDR: 60.0000 mg | DELAYED_RELEASE_CAPSULE | Freq: Every day | ORAL | Status: DC

## 2012-07-02 NOTE — Telephone Encounter (Signed)
Rx for dexilant sent to pharmacy, pt notified discount card placed up front along w/samples. Dexilant 60mg  Lot #[id number]; Exp Date 12/2014. x2 bottles

## 2012-07-07 ENCOUNTER — Other Ambulatory Visit: Payer: Self-pay | Admitting: *Deleted

## 2012-07-07 DIAGNOSIS — K219 Gastro-esophageal reflux disease without esophagitis: Secondary | ICD-10-CM

## 2012-07-07 MED ORDER — DEXLANSOPRAZOLE 60 MG PO CPDR: 60.0000 mg | DELAYED_RELEASE_CAPSULE | Freq: Every day | ORAL | Status: DC

## 2012-07-24 ENCOUNTER — Encounter: Payer: Self-pay | Admitting: Gastroenterology

## 2012-07-29 ENCOUNTER — Telehealth: Payer: Self-pay | Admitting: General Practice

## 2012-07-29 NOTE — Telephone Encounter (Signed)
See samples on Log Book

## 2012-07-29 NOTE — Telephone Encounter (Signed)
Pt aware samples placed up front for pick up . 

## 2012-07-29 NOTE — Telephone Encounter (Signed)
Pt called stating that the Dexilant is to expensive and wants to know if there is an OTC treatment that he could use until his appt with GI on the 19th. Please advise.

## 2012-08-05 ENCOUNTER — Ambulatory Visit (AMBULATORY_SURGERY_CENTER): Payer: BC Managed Care – PPO

## 2012-08-05 VITALS — Ht 69.0 in | Wt 177.0 lb

## 2012-08-05 DIAGNOSIS — K219 Gastro-esophageal reflux disease without esophagitis: Secondary | ICD-10-CM

## 2012-08-05 DIAGNOSIS — R1013 Epigastric pain: Secondary | ICD-10-CM

## 2012-08-06 ENCOUNTER — Encounter: Payer: Self-pay | Admitting: Gastroenterology

## 2012-08-15 ENCOUNTER — Ambulatory Visit (AMBULATORY_SURGERY_CENTER): Payer: BC Managed Care – PPO | Admitting: Gastroenterology

## 2012-08-15 ENCOUNTER — Encounter: Payer: Self-pay | Admitting: Gastroenterology

## 2012-08-15 VITALS — BP 113/60 | HR 69 | Temp 97.7°F | Resp 17 | Ht 69.0 in | Wt 177.0 lb

## 2012-08-15 DIAGNOSIS — R1013 Epigastric pain: Secondary | ICD-10-CM

## 2012-08-15 DIAGNOSIS — K219 Gastro-esophageal reflux disease without esophagitis: Secondary | ICD-10-CM

## 2012-08-15 MED ORDER — SODIUM CHLORIDE 0.9 % IV SOLN: 500.0000 mL | INTRAVENOUS | Status: DC

## 2012-08-15 MED ORDER — OMEPRAZOLE 40 MG PO CPDR: 40.0000 mg | DELAYED_RELEASE_CAPSULE | Freq: Every day | ORAL | Status: DC

## 2012-08-15 NOTE — Op Note (Signed)
[facility] [address] [place], [address]   ENDOSCOPY PROCEDURE REPORT  PATIENT: Eric Aguirre, Eric Aguirre  MR#: [id number] BIRTHDATE: 12-13-1960 , 52  yrs. old GENDER: Male ENDOSCOPIST:Jun Osment Hale Bogus, MD, Clementeen Graham REFERRED BY: Marga Melnick, M.D. PROCEDURE DATE:  08/15/2012 PROCEDURE:   EGD, screening ASA CLASS:    Class II INDICATIONS: history of GERD. MEDICATION: propofol (Diprivan) 200mg  IV TOPICAL ANESTHETIC:   Cetacaine Spray  DESCRIPTION OF PROCEDURE:   After the risks and benefits of the procedure were explained, informed consent was obtained.  The LB [id number]  endoscope was introduced through the mouth  and advanced to the second portion of the duodenum .  The instrument was slowly withdrawn as the mucosa was fully examined.      DUODENUM: The duodenal mucosa showed no abnormalities in the bulb and second portion of the duodenum.  STOMACH: The mucosa of the stomach appeared normal.  ESOPHAGUS: The mucosa of the esophagus appeared normal. INLET POUCH IN PROXIMAL ESOPHAGUS NOTED .SEE PICTURES.   Retroflexed views revealed no abnormalities.    The scope was then withdrawn from the patient and the procedure completed.  COMPLICATIONS: There were no complications.   ENDOSCOPIC IMPRESSION: 1.   The duodenal mucosa showed no abnormalities in the bulb and second portion of the duodenum 2.   The mucosa of the stomach appeared normal 3.   The mucosa of the esophagus appeared normal..NO BARRETT'S MUCOSA,INLET POUCHES NOTED.  RECOMMENDATIONS: Continue PPI    _______________________________ eSigned:  Mardella Layman, MD, [facility] 08/15/2012 3:05 PM      PATIENT NAME:  Eric Aguirre, Eric Aguirre MR#: [id number]

## 2012-08-15 NOTE — Patient Instructions (Addendum)
Discharge instructions given with verbal understanding. Normal exam. Resume previous medications. YOU HAD AN ENDOSCOPIC PROCEDURE TODAY AT THE [facility]: Refer to the procedure report that was given to you for any specific questions about what was found during the examination.  If the procedure report does not answer your questions, please call your gastroenterologist to clarify.  If you requested that your care partner not be given the details of your procedure findings, then the procedure report has been included in a sealed envelope for you to review at your convenience later.  YOU SHOULD EXPECT: Some feelings of bloating in the abdomen. Passage of more gas than usual.  Walking can help get rid of the air that was put into your GI tract during the procedure and reduce the bloating. If you had a lower endoscopy (such as a colonoscopy or flexible sigmoidoscopy) you may notice spotting of blood in your stool or on the toilet paper. If you underwent a bowel prep for your procedure, then you may not have a normal bowel movement for a few days.  DIET: Your first meal following the procedure should be a light meal and then it is ok to progress to your normal diet.  A half-sandwich or bowl of soup is an example of a good first meal.  Heavy or fried foods are harder to digest and may make you feel nauseous or bloated.  Likewise meals heavy in dairy and vegetables can cause extra gas to form and this can also increase the bloating.  Drink plenty of fluids but you should avoid alcoholic beverages for 24 hours.  ACTIVITY: Your care partner should take you home directly after the procedure.  You should plan to take it easy, moving slowly for the rest of the day.  You can resume normal activity the day after the procedure however you should NOT DRIVE or use heavy machinery for 24 hours (because of the sedation medicines used during the test).    SYMPTOMS TO REPORT IMMEDIATELY: A gastroenterologist  can be reached at any hour.  During normal business hours, 8:30 AM to 5:00 PM Monday through Friday, call [phone].  After hours and on weekends, please call the GI answering service at [phone] who will take a message and have the physician on call contact you.   Following upper endoscopy (EGD)  Vomiting of blood or coffee ground material  New chest pain or pain under the shoulder blades  Painful or persistently difficult swallowing  New shortness of breath  Fever of 100F or higher  Black, tarry-looking stools  FOLLOW UP: If any biopsies were taken you will be contacted by phone or by letter within the next 1-3 weeks.  Call your gastroenterologist if you have not heard about the biopsies in 3 weeks.  Our staff will call the home number listed on your records the next business day following your procedure to check on you and address any questions or concerns that you may have at that time regarding the information given to you following your procedure. This is a courtesy call and so if there is no answer at the home number and we have not heard from you through the emergency physician on call, we will assume that you have returned to your regular daily activities without incident.  SIGNATURES/CONFIDENTIALITY: You and/or your care partner have signed paperwork which will be entered into your electronic medical record.  These signatures attest to the fact that that the information above on your After   Visit Summary has been reviewed and is understood.  Full responsibility of the confidentiality of this discharge information lies with you and/or your care-partner. 

## 2012-08-15 NOTE — Progress Notes (Signed)
Patient did not experience any of the following events: a burn prior to discharge; a fall within the facility; wrong site/side/patient/procedure/implant event; or a hospital transfer or hospital admission upon discharge from the facility. (G8907) Patient did not have preoperative order for IV antibiotic SSI prophylaxis. (G8918)  

## 2012-08-18 ENCOUNTER — Telehealth: Payer: Self-pay | Admitting: *Deleted

## 2012-08-18 NOTE — Telephone Encounter (Signed)
  Follow up Call-  Call back number 08/15/2012  Post procedure Call Back phone  # [phone]  Permission to leave phone message Yes     Patient questions:  Do you have a fever, pain , or abdominal swelling? no Pain Score  0 *  Have you tolerated food without any problems? yes  Have you been able to return to your normal activities? yes  Do you have any questions about your discharge instructions: Diet   no Medications  no Follow up visit  no  Do you have questions or concerns about your Care? no  Actions: * If pain score is 4 or above: No action needed, pain <4.

## 2012-09-19 ENCOUNTER — Other Ambulatory Visit: Payer: Self-pay | Admitting: Internal Medicine

## 2012-09-19 NOTE — Telephone Encounter (Signed)
Refill done.  

## 2012-11-17 ENCOUNTER — Other Ambulatory Visit: Payer: Self-pay | Admitting: *Deleted

## 2012-11-17 ENCOUNTER — Telehealth: Payer: Self-pay | Admitting: *Deleted

## 2012-11-17 DIAGNOSIS — K219 Gastro-esophageal reflux disease without esophagitis: Secondary | ICD-10-CM

## 2012-11-17 MED ORDER — OMEPRAZOLE 40 MG PO CPDR: 40.0000 mg | DELAYED_RELEASE_CAPSULE | Freq: Every day | ORAL | Status: DC

## 2012-11-17 NOTE — Telephone Encounter (Signed)
Have a prescription request  from [facility] for Allopurinol 300 mg.  The prescription can be filled but I need to know what pharmacy to send it to.  In the past it has went to [facility].   AG cma

## 2012-11-17 NOTE — Telephone Encounter (Signed)
Patient request a 90 RX per fax from [facility]

## 2012-11-18 ENCOUNTER — Telehealth: Payer: Self-pay | Admitting: *Deleted

## 2012-11-18 ENCOUNTER — Other Ambulatory Visit: Payer: Self-pay | Admitting: *Deleted

## 2012-11-18 MED ORDER — ALLOPURINOL 300 MG PO TABS: ORAL_TABLET | ORAL | Status: DC

## 2012-11-18 NOTE — Telephone Encounter (Signed)
Rx was sent to [facility] per patient.  AG cma

## 2012-11-18 NOTE — Telephone Encounter (Signed)
I have made contact with Eric Aguirre and he stated that he is having all of his medications switch to [facility] this is a better choice and it helps him save money.   Ag cma

## 2012-11-19 ENCOUNTER — Other Ambulatory Visit: Payer: Self-pay | Admitting: *Deleted

## 2012-11-19 NOTE — Telephone Encounter (Signed)
Request for refill on Omeprazole  RX was already sent 8-18----90 with 11 refills Pharmacy notified

## 2013-01-15 ENCOUNTER — Telehealth: Payer: Self-pay

## 2013-01-15 NOTE — Telephone Encounter (Signed)
Medication List and allergies: done pt has a new med that he will bring tomorrow  [facility] for 90 day supply (mail order) [facility] and [facility] for local prescriptions  Immunizations due: unsure about flu vaccine this year  A/P:  LAST: HM due: PSA:   CCS: UTD 07/2009  DM: Due 05/2012 HTN: Due 05/2012 Lipids: Due   To Discuss with Provider: Patient states he is still feeling fatigue. Described as constant even after a good nights sleep Complaint of snoring  Rx for new GI med would like to fill from [facility] WNL 07/2012

## 2013-01-16 ENCOUNTER — Ambulatory Visit (INDEPENDENT_AMBULATORY_CARE_PROVIDER_SITE_OTHER): Payer: BC Managed Care – PPO | Admitting: Internal Medicine

## 2013-01-16 ENCOUNTER — Encounter: Payer: Self-pay | Admitting: Internal Medicine

## 2013-01-16 VITALS — BP 124/84 | HR 64 | Temp 97.9°F | Ht 69.25 in | Wt 178.2 lb

## 2013-01-16 DIAGNOSIS — R6882 Decreased libido: Secondary | ICD-10-CM

## 2013-01-16 DIAGNOSIS — K5731 Diverticulosis of large intestine without perforation or abscess with bleeding: Secondary | ICD-10-CM | POA: Insufficient documentation

## 2013-01-16 DIAGNOSIS — R5381 Other malaise: Secondary | ICD-10-CM

## 2013-01-16 DIAGNOSIS — Z0000 Encounter for general adult medical examination without abnormal findings: Secondary | ICD-10-CM

## 2013-01-16 DIAGNOSIS — Z23 Encounter for immunization: Secondary | ICD-10-CM

## 2013-01-16 NOTE — Progress Notes (Signed)
Subjective:    Patient ID: Eric Aguirre, male    DOB: [birth year], 52 y.o.   MRN: [id number]  HPI  He is here for a physical;acute issues include low grade fatigue & decreased libido.     Review of Systems    He denies fever, chills, sweats, weakness or unexplained weight loss. He has no melena or rectal bleeding. He's had some hair loss but no other significant change in his skin. He does have toenail fungus which is actually has caused some discomfort.  He is on a modified heart healthy diet; he exercises as walking  60 minutes 4-5 times per week  without symptoms. Specifically he denies chest pain, palpitations, dyspnea, or claudication.  Family history is negative for premature coronary disease. Cholesterol reveals no significant  LDL elevation; HDL has been protective .      Objective:   Physical Exam Gen.: Healthy and well-nourished in appearance. Alert, appropriate and cooperative throughout exam.Appears younger than stated age  Head: Normocephalic without obvious abnormalities  Eyes: No corneal or conjunctival inflammation noted. Pupils equal round reactive to light and accommodation.  Extraocular motion intact.  Ears: External  ear exam reveals no significant lesions or deformities. Canals clear .TMs normal. Hearing is grossly normal bilaterally. Nose: External nasal exam reveals no deformity or inflammation. Nasal mucosa are pink and moist. No lesions or exudates noted.   Mouth: Oral mucosa and oropharynx reveal no lesions or exudates. Teeth in good repair. Neck: No deformities, masses, or tenderness noted. Range of motion & Thyroid normal. Lungs: Normal respiratory effort; chest expands symmetrically. Lungs are clear to auscultation without rales, wheezes, or increased work of breathing. Heart: Normal rate and rhythm. Normal S1 and S2. No gallop, or rub. Click suggested @ apex; no MR murmur. Abdomen: Bowel sounds normal; abdomen soft and nontender. No masses, organomegaly or  hernias noted. Genitalia: Genitalia normal except for left varices. Prostate is normal without enlargement, asymmetry, nodularity, or induration.   As per Gyn                                  Musculoskeletal/extremities: No deformity or scoliosis noted of  the thoracic or lumbar spine.   No clubbing, cyanosis, edema, or significant extremity  deformity noted. Range of motion normal .Tone & strength  Normal. Joints normal . Mycotic changes toenails . Able to lie down & sit up w/o help. Negative SLR bilaterally Vascular: Carotid, radial artery, dorsalis pedis and  posterior tibial pulses are full and equal. No bruits present. Neurologic: Alert and oriented x3. Deep tendon reflexes symmetrical and normal.  Gait normal  including heel & toe walking .        Skin: Intact without suspicious lesions or rashes. Lymph: No cervical, axillary, or inguinal lymphadenopathy present. Psych: Mood and affect are normal. Normally interactive                                                                                        Assessment & Plan:  #1 comprehensive physical exam; no acute findings #2 mycotic nail changes; trial of Jublia  Plan: see Orders  & Recommendations

## 2013-01-16 NOTE — Patient Instructions (Addendum)
Order for Labs entered into  the computer; these will be performed at [address] [facility]. across from [facility] No appointment is necessary.  If labs are normal; I recommend reevaluation of your sleep apnea  By Dr Marylou Flesher.

## 2013-01-22 ENCOUNTER — Other Ambulatory Visit (INDEPENDENT_AMBULATORY_CARE_PROVIDER_SITE_OTHER): Payer: BC Managed Care – PPO

## 2013-01-22 DIAGNOSIS — Z0000 Encounter for general adult medical examination without abnormal findings: Secondary | ICD-10-CM

## 2013-01-22 LAB — LIPID PANEL
HDL: 57.8 mg/dL (ref >39.00)
Total CHOL/HDL Ratio: 3
Triglycerides: 45 mg/dL (ref 0.0–149.0)
VLDL: 9 mg/dL (ref 0.0–40.0)

## 2013-01-22 LAB — HEPATIC FUNCTION PANEL
Albumin: 4.2 g/dL (ref 3.5–5.2)
Bilirubin, Direct: 0.2 mg/dL (ref 0.0–0.3)
Total Protein: 6.7 g/dL (ref 6.0–8.3)

## 2013-01-22 LAB — TESTOSTERONE: Testosterone: 352.22 ng/dL (ref 350.00–890.00)

## 2013-01-22 LAB — PSA: PSA: 0.58 ng/mL (ref 0.10–4.00)

## 2013-01-23 ENCOUNTER — Encounter: Payer: Self-pay | Admitting: General Practice

## 2013-01-25 ENCOUNTER — Encounter: Payer: Self-pay | Admitting: Internal Medicine

## 2013-02-02 ENCOUNTER — Telehealth: Payer: Self-pay | Admitting: Internal Medicine

## 2013-02-02 NOTE — Telephone Encounter (Signed)
Patient states that he needs a refill on his omeprazole (PRILOSEC) 40 MG capsule, however, [facility] needs for our office to call them at [phone] before refilling. Please advise.

## 2013-02-03 ENCOUNTER — Other Ambulatory Visit: Payer: Self-pay | Admitting: *Deleted

## 2013-02-03 MED ORDER — OMEPRAZOLE 40 MG PO CPDR: 40.0000 mg | DELAYED_RELEASE_CAPSULE | Freq: Every day | ORAL | Status: DC

## 2013-02-03 NOTE — Telephone Encounter (Signed)
Prilosec refilled.

## 2013-02-11 ENCOUNTER — Other Ambulatory Visit: Payer: Self-pay | Admitting: *Deleted

## 2013-02-11 MED ORDER — OMEPRAZOLE 40 MG PO CPDR: 40.0000 mg | DELAYED_RELEASE_CAPSULE | Freq: Every day | ORAL | Status: DC

## 2013-02-11 NOTE — Telephone Encounter (Signed)
Done

## 2013-02-11 NOTE — Telephone Encounter (Signed)
Patient called and stated that [facility] and they still have not received his refill for Prilosec yet. Patient would like for [place] to confirm with [facility] they have received it.

## 2013-02-11 NOTE — Telephone Encounter (Signed)
Omeprazole refilled.

## 2013-04-20 ENCOUNTER — Other Ambulatory Visit: Payer: Self-pay | Admitting: *Deleted

## 2013-04-20 MED ORDER — ALLOPURINOL 300 MG PO TABS: ORAL_TABLET | ORAL | Status: DC

## 2013-06-09 ENCOUNTER — Encounter: Payer: Self-pay | Admitting: Internal Medicine

## 2013-09-02 ENCOUNTER — Other Ambulatory Visit: Payer: Self-pay | Admitting: Internal Medicine

## 2013-09-24 ENCOUNTER — Other Ambulatory Visit: Payer: Self-pay | Admitting: Internal Medicine

## 2013-12-01 ENCOUNTER — Other Ambulatory Visit: Payer: Self-pay | Admitting: Internal Medicine

## 2014-08-13 ENCOUNTER — Institutional Professional Consult (permissible substitution): Payer: BLUE CROSS/BLUE SHIELD | Admitting: Neurology

## 2014-08-23 ENCOUNTER — Other Ambulatory Visit: Payer: Self-pay | Admitting: Internal Medicine

## 2014-08-23 NOTE — Telephone Encounter (Signed)
OK X 1 Needs OV; last seen 2014

## 2014-08-23 NOTE — Telephone Encounter (Signed)
Patients last office visit was October, 2014---are you ok with filling rx---please advise, thanks

## 2014-08-24 ENCOUNTER — Institutional Professional Consult (permissible substitution): Payer: BLUE CROSS/BLUE SHIELD | Admitting: Neurology

## 2014-09-07 ENCOUNTER — Ambulatory Visit (INDEPENDENT_AMBULATORY_CARE_PROVIDER_SITE_OTHER): Payer: BLUE CROSS/BLUE SHIELD | Admitting: Neurology

## 2014-09-07 ENCOUNTER — Encounter: Payer: Self-pay | Admitting: Neurology

## 2014-09-07 VITALS — BP 124/80 | HR 74 | Resp 20 | Ht 69.69 in | Wt 179.0 lb

## 2014-09-07 DIAGNOSIS — Z9989 Dependence on other enabling machines and devices: Principal | ICD-10-CM

## 2014-09-07 DIAGNOSIS — R5383 Other fatigue: Secondary | ICD-10-CM | POA: Insufficient documentation

## 2014-09-07 DIAGNOSIS — G4733 Obstructive sleep apnea (adult) (pediatric): Secondary | ICD-10-CM | POA: Diagnosis not present

## 2014-09-07 NOTE — Addendum Note (Signed)
Addended by: Lester [place] A on: 09/07/2014 10:48 AM   Modules accepted: Orders

## 2014-09-07 NOTE — Progress Notes (Signed)
SLEEP MEDICINE CLINIC   Provider:  Larey Seat, M D  Referring Provider: Hendricks Limes, MD Primary Care Physician:  Jerlyn Ly, MD  Chief Complaint  Patient presents with  . cpap    rm 10, cpap, hasn't been seen in four years, alone    HPI:  Eric Aguirre is a 54 y.o. male seen here as a referral from [place] for fatigue and EDS while on CPAP.   Eric Aguirre, a caucasian , married male, father of 3 , presents with fatigue. Eric Aguirre had been evaluated in a sleep study in 2012 at that time his primary care physician was Dr. Unice Cobble. He was diagnosed with obstructive sleep apnea and titrated to CPAP did well at only 7 cm water pressure. His AHI was 0.0 and the oxygen nadir was 93%. He has been using his CPAP since about the machine here today with him. He had not had any follow-up appointments but currently his symptoms are somewhat suggestive that apnea could still play a role. Eric Aguirre and his spouse have made great efforts to keep her weight down, they have lived healthier than in the years before a moderate diet, reduction in alcohol intake etc. and so he feels overall that he is healthier than he probably was 5 or 6 years ago. 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 He travels with his son and is very busy.   He wears a fit bit and wanted to make me aware of the findings. The last 2 weeks documented an average sleep time of probably around 7 hours. Most nights seem to be uninterrupted. He is going to bed a little earlier he redo reduced his reading time to about 30 minutes and usually falls asleep fairly promptly. He is using a CPAP. He has reduced his caffeine intake to the morning hours only and avoids anything but water to hydrate with. Not have as much nocturia since he reduced his caffeine intake. If he naps without his CPAP his wife has made him aware that he still snores. The CPAP however has not  allowed him to snore and there were no witnessed apneas while being treated.  He needs new supplies.  Dr. Joylene Draft obtained a vitamin D level and testosterone level which has both been in the normal range of right low normal. Urinalysis from 06-16-14 was negative, electrolyte panel was negative compresses metabolic panel was negative. Cholesterol was excellent at 160 total. Creatinine 1.0 uric acid 5.6 TSH 1.5 PSA (67.   Review of Systems: Out of a complete 14 system review, the patient complains of only the following symptoms, and all other reviewed systems are negative.   Epworth score  5  , Fatigue severity score 46  , adult PHQ 9 depression score 2    History   Social History  . Marital Status: Married    Spouse Name: N/A  . Number of Children: 3  . Years of Education: N/A   Occupational History  . Sales    Social History Main Topics  . Smoking status: Never Smoker   . Smokeless tobacco: Never Used  . Alcohol Use: 3.6 - 4.8 oz/week    3-4 Cans of beer, 3-4 Standard drinks or equivalent per week     Comment: drinks on weekends  . Drug Use: No  . Sexual Activity: Not on file   Other Topics Concern  . Not on file  Social History Narrative   Drinks two large cups of caffeine daily.    Family History  Problem Relation Age of Onset  . Diabetes Father     AODM  . Hypertension Father     cns aneurysm rupture  . Cancer Paternal Uncle     ? primary  . Arthritis      MGGM  . Thyroid disease Mother     goiter  . Dementia Mother   . Ulcers Neg Hx   . Stroke Neg Hx   . Celiac disease Sister     Past Medical History  Diagnosis Date  . Gout 2005    uric acid 9   . Gilbert syndrome   . Sleep apnea     Dr Beacher May  . Pneumonia     as child  . Diverticulosis of colon (without mention of hemorrhage)   . GERD (gastroesophageal reflux disease)     Past Surgical History  Procedure Laterality Date  . Vasectomy    . Colonoscopy  2011    negative   . Laparoscopic  appendectomy  02/12/2011    Harl Bowie, MD; perforated appendix    Current Outpatient Prescriptions  Medication Sig Dispense Refill  . allopurinol (ZYLOPRIM) 300 MG tablet TAKE 1 TABLET DAILY 90 tablet 0  . clonazePAM (KLONOPIN) 0.5 MG tablet Take 0.5 mg by mouth.    . colchicine (COLCRYS) 0.6 MG tablet     . omeprazole (PRILOSEC) 40 MG capsule TAKE 1 CAPSULE DAILY 90 capsule 3   No current facility-administered medications for this visit.    Allergies as of 09/07/2014  . (No Known Allergies)    Vitals: BP 124/80 mmHg  Pulse 74  Resp 20  Ht 5' 9.69" (1.77 m)  Wt 179 lb (81.194 kg)  BMI 25.92 kg/m2 Last Weight:  Wt Readings from Last 1 Encounters:  09/07/14 179 lb (81.194 kg)       Last Height:   Ht Readings from Last 1 Encounters:  09/07/14 5' 9.69" (1.77 m)    Physical exam:  General: The patient is awake, alert and appears not in acute distress. The patient is well groomed. Head: Normocephalic, atraumatic. Neck is supple. Mallampati 2 ,  neck circumference:  15. Nasal airflow unrestricted , TMJ is   evident , click left an right,. Retrognathia is seen.  Cardiovascular:  Regular rate and rhythm , without  murmurs or carotid bruit, and without distended neck veins. Respiratory: Lungs are clear to auscultation. Skin:  Without evidence of edema, or rash Trunk: BMI is  elevated and patient  has normal posture.  Neurologic exam : The patient is awake and alert, oriented to place and time.   Memory subjective described as intact. There is a normal attention span & concentration ability.  Speech is fluent without  dysarthria, dysphonia or aphasia. Mood and affect are appropriate.  Cranial nerves: Pupils are equal and briskly reactive to light. Funduscopic exam without evidence of pallor or edema.  Extraocular movements  in vertical and horizontal planes intact and without nystagmus. Visual fields by finger perimetry are intact. Hearing to finger rub intact.   Facial sensation intact to fine touch. Facial motor strength is symmetric and tongue and uvula move midline.  Motor exam:   Normal tone, muscle bulk and symmetric, strength in all extremities.  Sensory:  Fine touch, pinprick and vibration were tested in all extremities.  Proprioception is  normal.  Coordination: Rapid alternating movements in the fingers/hands is normal.  Finger-to-nose  maneuver normal without evidence of ataxia, dysmetria or tremor.  Gait and station: Patient walks without assistive device and is able unassisted to climb up to the exam table.  Strength within normal limits. Stance is stable and normal.    Deep tendon reflexes: in the  upper and lower extremities are symmetric and intact. Babinski maneuver response is downgoing.   Assessment:  After physical and neurologic examination, review of laboratory studies, imaging, neurophysiology testing and pre-existing records, assessment is  1) reflecting on the Epworth sleepiness score fatigue severity score I feel that Eric Aguirre is more fatigued and sleepy. The fatigue may not have anything to do with his previous diagnosis of obstructive sleep apnea and his presumed treated for this condition in any way. He is using a [employer]. I will order an auto-titrator for 30 days, 5-10 cm water.  He lost over 12 pounds since last test, he has been developing more muscle bulk.    The patient was advised of the nature of the diagnosed sleep disorder , the treatment options and risks for general a health and wellness arising from not treating the condition.  Visit duration was 30 minutes.   Plan:  Treatment plan and additional workup :       Larey Seat MD  09/07/2014

## 2014-09-10 ENCOUNTER — Encounter: Payer: Self-pay | Admitting: Gastroenterology

## 2014-10-15 ENCOUNTER — Encounter: Payer: Self-pay | Admitting: Neurology

## 2014-11-08 ENCOUNTER — Telehealth: Payer: Self-pay

## 2014-11-08 NOTE — Telephone Encounter (Signed)
Received notice from [facility] that they have called multiple times to set pt up on an auto per Dr. Edwena Felty order but pt is not returning phone calls. [facility] is mailing a letter to him.

## 2014-12-31 ENCOUNTER — Other Ambulatory Visit: Payer: Self-pay | Admitting: Internal Medicine

## 2015-08-15 DIAGNOSIS — Z125 Encounter for screening for malignant neoplasm of prostate: Secondary | ICD-10-CM | POA: Diagnosis not present

## 2015-08-15 DIAGNOSIS — M109 Gout, unspecified: Secondary | ICD-10-CM | POA: Diagnosis not present

## 2015-08-15 DIAGNOSIS — Z23 Encounter for immunization: Secondary | ICD-10-CM | POA: Diagnosis not present

## 2015-08-15 DIAGNOSIS — Z0000 Encounter for general adult medical examination without abnormal findings: Secondary | ICD-10-CM | POA: Diagnosis not present

## 2015-08-22 DIAGNOSIS — J3089 Other allergic rhinitis: Secondary | ICD-10-CM | POA: Diagnosis not present

## 2015-08-22 DIAGNOSIS — K219 Gastro-esophageal reflux disease without esophagitis: Secondary | ICD-10-CM | POA: Diagnosis not present

## 2015-08-22 DIAGNOSIS — R5383 Other fatigue: Secondary | ICD-10-CM | POA: Diagnosis not present

## 2015-08-22 DIAGNOSIS — G4733 Obstructive sleep apnea (adult) (pediatric): Secondary | ICD-10-CM | POA: Diagnosis not present

## 2015-08-22 DIAGNOSIS — Z1389 Encounter for screening for other disorder: Secondary | ICD-10-CM | POA: Diagnosis not present

## 2015-08-22 DIAGNOSIS — Z0000 Encounter for general adult medical examination without abnormal findings: Secondary | ICD-10-CM | POA: Diagnosis not present

## 2015-08-25 DIAGNOSIS — Z1212 Encounter for screening for malignant neoplasm of rectum: Secondary | ICD-10-CM | POA: Diagnosis not present

## 2016-03-14 DIAGNOSIS — Z6826 Body mass index (BMI) 26.0-26.9, adult: Secondary | ICD-10-CM | POA: Diagnosis not present

## 2016-03-14 DIAGNOSIS — R1032 Left lower quadrant pain: Secondary | ICD-10-CM | POA: Diagnosis not present

## 2016-09-12 DIAGNOSIS — R5383 Other fatigue: Secondary | ICD-10-CM | POA: Diagnosis not present

## 2016-09-12 DIAGNOSIS — Z125 Encounter for screening for malignant neoplasm of prostate: Secondary | ICD-10-CM | POA: Diagnosis not present

## 2016-09-12 DIAGNOSIS — M109 Gout, unspecified: Secondary | ICD-10-CM | POA: Diagnosis not present

## 2016-09-12 DIAGNOSIS — Z0000 Encounter for general adult medical examination without abnormal findings: Secondary | ICD-10-CM | POA: Diagnosis not present

## 2016-09-24 DIAGNOSIS — R350 Frequency of micturition: Secondary | ICD-10-CM | POA: Diagnosis not present

## 2016-09-24 DIAGNOSIS — R1032 Left lower quadrant pain: Secondary | ICD-10-CM | POA: Diagnosis not present

## 2016-09-24 DIAGNOSIS — Z0000 Encounter for general adult medical examination without abnormal findings: Secondary | ICD-10-CM | POA: Diagnosis not present

## 2016-09-24 DIAGNOSIS — M109 Gout, unspecified: Secondary | ICD-10-CM | POA: Diagnosis not present

## 2016-09-24 DIAGNOSIS — Z6826 Body mass index (BMI) 26.0-26.9, adult: Secondary | ICD-10-CM | POA: Diagnosis not present

## 2016-09-24 DIAGNOSIS — Z1389 Encounter for screening for other disorder: Secondary | ICD-10-CM | POA: Diagnosis not present

## 2016-09-24 DIAGNOSIS — R5383 Other fatigue: Secondary | ICD-10-CM | POA: Diagnosis not present

## 2016-09-24 DIAGNOSIS — G4733 Obstructive sleep apnea (adult) (pediatric): Secondary | ICD-10-CM | POA: Diagnosis not present

## 2017-01-02 DIAGNOSIS — R197 Diarrhea, unspecified: Secondary | ICD-10-CM | POA: Diagnosis not present

## 2017-01-02 DIAGNOSIS — K219 Gastro-esophageal reflux disease without esophagitis: Secondary | ICD-10-CM | POA: Diagnosis not present

## 2017-01-02 DIAGNOSIS — L821 Other seborrheic keratosis: Secondary | ICD-10-CM | POA: Diagnosis not present

## 2017-01-02 DIAGNOSIS — Z23 Encounter for immunization: Secondary | ICD-10-CM | POA: Diagnosis not present

## 2017-10-21 DIAGNOSIS — R82998 Other abnormal findings in urine: Secondary | ICD-10-CM | POA: Diagnosis not present

## 2017-10-21 DIAGNOSIS — R5383 Other fatigue: Secondary | ICD-10-CM | POA: Diagnosis not present

## 2017-10-21 DIAGNOSIS — M109 Gout, unspecified: Secondary | ICD-10-CM | POA: Diagnosis not present

## 2017-10-21 DIAGNOSIS — Z125 Encounter for screening for malignant neoplasm of prostate: Secondary | ICD-10-CM | POA: Diagnosis not present

## 2017-10-21 DIAGNOSIS — Z0000 Encounter for general adult medical examination without abnormal findings: Secondary | ICD-10-CM | POA: Diagnosis not present

## 2017-10-28 DIAGNOSIS — M2550 Pain in unspecified joint: Secondary | ICD-10-CM | POA: Diagnosis not present

## 2017-10-28 DIAGNOSIS — L821 Other seborrheic keratosis: Secondary | ICD-10-CM | POA: Diagnosis not present

## 2017-10-28 DIAGNOSIS — M545 Low back pain: Secondary | ICD-10-CM | POA: Diagnosis not present

## 2017-10-28 DIAGNOSIS — Z1389 Encounter for screening for other disorder: Secondary | ICD-10-CM | POA: Diagnosis not present

## 2017-10-28 DIAGNOSIS — M109 Gout, unspecified: Secondary | ICD-10-CM | POA: Diagnosis not present

## 2017-10-28 DIAGNOSIS — K219 Gastro-esophageal reflux disease without esophagitis: Secondary | ICD-10-CM | POA: Diagnosis not present

## 2017-10-28 DIAGNOSIS — G4733 Obstructive sleep apnea (adult) (pediatric): Secondary | ICD-10-CM | POA: Diagnosis not present

## 2017-10-28 DIAGNOSIS — Z0000 Encounter for general adult medical examination without abnormal findings: Secondary | ICD-10-CM | POA: Diagnosis not present

## 2017-11-04 DIAGNOSIS — Z1212 Encounter for screening for malignant neoplasm of rectum: Secondary | ICD-10-CM | POA: Diagnosis not present

## 2017-12-30 ENCOUNTER — Encounter: Payer: Self-pay | Admitting: Neurology

## 2017-12-31 ENCOUNTER — Encounter: Payer: Self-pay | Admitting: Neurology

## 2017-12-31 ENCOUNTER — Ambulatory Visit (INDEPENDENT_AMBULATORY_CARE_PROVIDER_SITE_OTHER): Payer: BLUE CROSS/BLUE SHIELD | Admitting: Neurology

## 2017-12-31 VITALS — BP 128/82 | HR 77 | Ht 68.5 in | Wt 169.0 lb

## 2017-12-31 DIAGNOSIS — R0683 Snoring: Secondary | ICD-10-CM

## 2017-12-31 DIAGNOSIS — Z6825 Body mass index (BMI) 25.0-25.9, adult: Secondary | ICD-10-CM | POA: Diagnosis not present

## 2017-12-31 DIAGNOSIS — F458 Other somatoform disorders: Secondary | ICD-10-CM | POA: Diagnosis not present

## 2017-12-31 DIAGNOSIS — G4733 Obstructive sleep apnea (adult) (pediatric): Secondary | ICD-10-CM | POA: Diagnosis not present

## 2017-12-31 DIAGNOSIS — Z9989 Dependence on other enabling machines and devices: Secondary | ICD-10-CM

## 2017-12-31 NOTE — Patient Instructions (Signed)

## 2017-12-31 NOTE — Progress Notes (Signed)
SLEEP MEDICINE CLINIC   Provider:  Larey Seat, M D  Primary Care Physician:  Crist Infante, MD   Referring Provider: Crist Infante, MD    Chief Complaint  Patient presents with  . New Patient (Initial Visit)    Room. 10. Pt is alone.     HPI:  Eric Aguirre is a 57 y.o. male patient, husband of Sheralyn Boatman, MD and seen here on 12-31-2017  in a referral from Dr. Joylene Draft for a new sleep evaluation.  Eric Aguirre travels a lot and feels this old CPAP machine is not longer serving him, he lost weight and developed aerophagia. This may be a symptom of too much pressure.  He has a medical history of gout which has been medication controlled and diet controlled, he does not have significant other weight issues, BMI is under 30.  Last visit with Dr. Haynes Kerns was on 28 October 2017 and his BMI was 25.  Gout appeared at age 32 and affected the left hallux.  He has sometimes lower back issues.  He was diagnosed with sleep apnea and has been on CPAP since 2008.  History of GERD, normal childhood illnesses, sometimes allergic rhinitis.  Compliance report for the patient's Philips Respironics C-Flex CPAP machine for the last 30 days shows a 43% compliance, on days used the average user time is 4 hours 48 minutes only, the average AHI was 2.3 at a CPAP setting of 7 cmH2O.  REM time was only 5 minutes and he has an EPR setting of 1 cm.  They seem to be actually more central apneas with and his residual apnea count than obstructive.  This also indicates the risks that he is actually overtreated and may no longer have the underlying need for CPAP.   Sleep habits are as follows: dinner time is around 6 PM, walking after dinner, [employer] 5 days a week, spinning classes.  Bedtime is around 11 PM.  Bedroom is cool, quiet and dark. No pets. Sleeping on his right side, wife says he snores. Wakes up at 3 for nocturia, he dreams - well.  Rising at 6 AM, still works in Manufacturing systems engineer.  Sleep  medical history and family sleep history:  OSA diagnosed in2008=same machine ever since.  No tonsillectomy, no retainers, no braces. No sleep walking history.   Social history: Empty nest- [place] has moved to boarding school in [place]. Non smoker, wine drinker- bourbon at night before dinner.   Review of Systems: Out of a complete 14 system review, the patient complains of only the following symptoms, and all other reviewed systems are negative.  Epworth score 3/ 24  , Fatigue severity score 27/63  , depression score n/a    Social History   Socioeconomic History  . Marital status: Married    Spouse name: Not on file  . Number of children: 3  . Years of education: Not on file  . Highest education level: Not on file  Occupational History  . Occupation: Scientist, clinical (histocompatibility and immunogenetics): [employer]  Social Needs  . Financial resource strain: Not on file  . Food insecurity:    Worry: Not on file    Inability: Not on file  . Transportation needs:    Medical: Not on file    Non-medical: Not on file  Tobacco Use  . Smoking status: Never Smoker  . Smokeless tobacco: Never Used  Substance and Sexual Activity  . Alcohol use: Yes    Alcohol/week: 6.0 - 8.0 standard  drinks    Types: 3 - 4 Cans of beer, 3 - 4 Standard drinks or equivalent per week    Comment: drinks on weekends  . Drug use: No  . Sexual activity: Not on file  Lifestyle  . Physical activity:    Days per week: Not on file    Minutes per session: Not on file  . Stress: Not on file  Relationships  . Social connections:    Talks on phone: Not on file    Gets together: Not on file    Attends religious service: Not on file    Active member of club or organization: Not on file    Attends meetings of clubs or organizations: Not on file    Relationship status: Not on file  . Intimate partner violence:    Fear of current or ex partner: Not on file    Emotionally abused: Not on file    Physically abused: Not on file    Forced sexual  activity: Not on file  Other Topics Concern  . Not on file  Social History Narrative   Drinks two large cups of caffeine daily.    Family History  Problem Relation Age of Onset  . Diabetes Father        AODM  . Hypertension Father        cns aneurysm rupture  . Cancer Paternal Uncle        ? primary  . Arthritis Unknown        MGGM  . Thyroid disease Mother        goiter  . Dementia Mother   . Ulcers Neg Hx   . Stroke Neg Hx   . Celiac disease Sister     Past Medical History:  Diagnosis Date  . Diverticulosis of colon (without mention of hemorrhage)   . GERD (gastroesophageal reflux disease)   . Gilbert syndrome   . Gout 2005   uric acid 9   . Pneumonia    as child  . Sleep apnea    Dr Beacher May    Past Surgical History:  Procedure Laterality Date  . COLONOSCOPY  2011   negative   . LAPAROSCOPIC APPENDECTOMY  02/12/2011   Harl Bowie, MD; perforated appendix  . VASECTOMY      Current Outpatient Medications  Medication Sig Dispense Refill  . allopurinol (ZYLOPRIM) 300 MG tablet TAKE 1 TABLET DAILY 90 tablet 0  . cholecalciferol (VITAMIN D) 1000 units tablet Take 1,000 Units by mouth daily.    Marland Kitchen ibuprofen (ADVIL,MOTRIN) 400 MG tablet Take 400 mg by mouth every 6 (six) hours as needed.    Marland Kitchen omeprazole (PRILOSEC) 40 MG capsule TAKE 1 CAPSULE DAILY 90 capsule 3   No current facility-administered medications for this visit.     Allergies as of 12/31/2017  . (No Known Allergies)    Vitals: BP 128/82   Pulse 77   Ht 5' 8.5" (1.74 m)   Wt 169 lb (76.7 kg)   BMI 25.32 kg/m  Last Weight:  Wt Readings from Last 1 Encounters:  12/31/17 169 lb (76.7 kg)   TMH:DQQI mass index is 25.32 kg/m.     Last Height:   Ht Readings from Last 1 Encounters:  12/31/17 5' 8.5" (1.74 m)    Physical exam:  General: The patient is awake, alert and appears not in acute distress. The patient is well groomed. Head: Normocephalic, atraumatic. Neck is supple. Mallampati   4-  neck circumference: 16"  Nasal airflow patent ,  Retrognathia is seen.  Cardiovascular:  Regular rate and rhythm , without  murmurs or carotid bruit, and without distended neck veins. Respiratory: Lungs are clear to auscultation. Skin:  Without evidence of edema, or rash Trunk: BMI is 25. The patient's posture is erect.  Neurologic exam : The patient is awake and alert, oriented to place and time.   Attention span & concentration ability appears normal.  Speech is fluent, without dysarthria, dysphonia or aphasia.  Mood and affect are appropriate.  Cranial nerves: Pupils are equal and briskly reactive to light. Extraocular movements  in vertical and horizontal planes intact and without nystagmus. Visual fields by finger perimetry are intact.Hearing to finger rub intact.  Facial sensation intact to fine touch. Facial motor strength is symmetric and tongue and uvula move midline. Shoulder shrug was symmetrical.  Motor exam:   Normal tone, muscle bulk and symmetric strength in all extremities. Sensory:  Fine touch, pinprick and vibration were tested in all extremities. Proprioception tested in the upper extremities was normal. Coordination: Finger-to-nose maneuver  normal without evidence of ataxia, dysmetria or tremor. Gait and station:  Strength within normal limits.  Stance is stable and normal. Turns with 3 Steps.  Deep tendon reflexes: in the  upper and lower extremities are symmetric and intact.   Assessment:  After physical and neurologic examination, review of laboratory studies,  Personal review of imaging studies, reports of other /same  Imaging studies, results of polysomnography and / or neurophysiology testing and pre-existing records as far as provided in visit., my assessment is:   1) Check if OSA is still present.  He is still snoring, may be more UARS than OSA.    The patient was advised of the nature of the diagnosed disorder , the treatment options and the  risks for  general health and wellness arising from not treating the condition.   I spent more than 30 minutes of face to face time with the patient.  Greater than 50% of time was spent in counseling and coordination of care. We have discussed the diagnosis and differential and I answered the patient's questions.    Plan:  Treatment plan and additional workup :  BCBS - PPO.  HST on watch pat- 3% desat. setting.    Larey Seat, MD 06/01/5943, 8:59 AM  Certified in Neurology by ABPN Certified in [place] by [facility] [address] [place], [place] [address]

## 2018-02-10 ENCOUNTER — Ambulatory Visit (INDEPENDENT_AMBULATORY_CARE_PROVIDER_SITE_OTHER): Payer: BLUE CROSS/BLUE SHIELD | Admitting: Neurology

## 2018-02-10 DIAGNOSIS — Z6825 Body mass index (BMI) 25.0-25.9, adult: Secondary | ICD-10-CM

## 2018-02-10 DIAGNOSIS — R0683 Snoring: Secondary | ICD-10-CM

## 2018-02-10 DIAGNOSIS — Z9989 Dependence on other enabling machines and devices: Principal | ICD-10-CM

## 2018-02-10 DIAGNOSIS — G4733 Obstructive sleep apnea (adult) (pediatric): Secondary | ICD-10-CM

## 2018-02-10 DIAGNOSIS — F458 Other somatoform disorders: Secondary | ICD-10-CM

## 2018-02-14 NOTE — Procedures (Signed)
[facility] [place], [place] [address] NAME:  Eric Aguirre                                                                DOB: [birth year] MEDICAL RECORD [id number]                                               DOS:  02/10/2018 REFERRING PHYSICIAN: Crist Infante, M.D. STUDY PERFORMED: Home Sleep Test on apnea link  HISTORY: Eric Aguirre is a 57 y.o. male patient seen on 12-31-2017 in a referral from Dr. Joylene Draft for a new sleep evaluation. Joud travels a lot and feels this old CPAP machine is no longer serving him, he lost weight and may have too much pressure applied by his current settings, as he developed aerophagia. Last visit with Dr. Joylene Draft was on 28 October 2017 and his BMI was 25. He was diagnosed with sleep apnea and has been on CPAP since 2008.  History of GERD, normal childhood illnesses, seasonal allergic rhinitis.  Compliance report for the patient's Philips Respironics C-Flex CPAP machine for the last 30 days shows a 43% compliance, on days used the average user time is 4 hours 48 minutes only, the average AHI was 2.3/h at a CPAP setting of 7 cmH2O.  Ramp time was only 5 minutes and he has an EPR setting of 1 cm.  They seem to be actually more central apneas within residual apnea count than obstructive apneas.  This also indicates the risks that he is actually "over-treated" and may no longer have a need for CPAP.  STUDY RESULTS:  Total Recording Time: 9 hours 31 minutes, valid test time 6 h 7 min.  Total Apnea/Hypopnea Index (AHI):  5.7 /h, RDI:  9.0/h Average Oxygen Saturation (SpO2) :   94 %, Lowest Oxygen Saturation: 86 %  Total Time in Oxygen Saturation below 89 %: 1.0 minute.  Average Heart Rate:   63 bpm in SR (between 44 and 97 bpm). IMPRESSION: Very mild obstructive sleep apnea (no centrals noted) at AHI 5.7/h and moderately loud snoring are noted. No clinically relevant oxygen desaturation by nadir nor duration.  RECOMMENDATION:  CPAP is optional at this low AHI (Apnea Hypopnea Index). The residual Snoring may respond to a dental device, avoiding supine sleep and/or treating nasal congestion in order to allow easier non- oral breathing. I certify that I have reviewed the raw data recording prior to the issuance of this report in accordance with the standards of the American Academy of Sleep Medicine (AASM). Larey Seat, M.D.   02-14-2018   Medical Director of [facility], accredited by the AASM. Diplomat of the ABPN and ABSM.

## 2018-02-17 ENCOUNTER — Telehealth: Payer: Self-pay | Admitting: Neurology

## 2018-02-17 DIAGNOSIS — Z9989 Dependence on other enabling machines and devices: Principal | ICD-10-CM

## 2018-02-17 DIAGNOSIS — Z6825 Body mass index (BMI) 25.0-25.9, adult: Secondary | ICD-10-CM

## 2018-02-17 DIAGNOSIS — G4730 Sleep apnea, unspecified: Secondary | ICD-10-CM

## 2018-02-17 DIAGNOSIS — R0683 Snoring: Secondary | ICD-10-CM

## 2018-02-17 DIAGNOSIS — G4733 Obstructive sleep apnea (adult) (pediatric): Secondary | ICD-10-CM

## 2018-02-17 NOTE — Telephone Encounter (Signed)
Pt requesting a call stating he would like Dr. Brett Fairy to write an rx for a dental piece that would prevent  Him from snoring. Please advise

## 2018-02-18 NOTE — Telephone Encounter (Signed)
Called the patient and informed the patient of his sleep study results. Pt has already spoke with Dr Brett Fairy about his results. He was able to already talk to his dentist Dr Mina Marble at [facility] and Dr Mina Marble can make the device for the patient. I will place a referral for the patient to Dr Mina Marble to have this device made for treatment of mild apnea and snoring.

## 2018-02-19 NOTE — Telephone Encounter (Signed)
Done sent on 02/18/2018

## 2018-11-24 DIAGNOSIS — Z0000 Encounter for general adult medical examination without abnormal findings: Secondary | ICD-10-CM | POA: Diagnosis not present

## 2018-11-24 DIAGNOSIS — R82998 Other abnormal findings in urine: Secondary | ICD-10-CM | POA: Diagnosis not present

## 2018-11-24 DIAGNOSIS — R5383 Other fatigue: Secondary | ICD-10-CM | POA: Diagnosis not present

## 2018-11-24 DIAGNOSIS — Z23 Encounter for immunization: Secondary | ICD-10-CM | POA: Diagnosis not present

## 2018-11-25 DIAGNOSIS — Z125 Encounter for screening for malignant neoplasm of prostate: Secondary | ICD-10-CM | POA: Diagnosis not present

## 2018-11-25 DIAGNOSIS — E7849 Other hyperlipidemia: Secondary | ICD-10-CM | POA: Diagnosis not present

## 2018-12-01 DIAGNOSIS — G4733 Obstructive sleep apnea (adult) (pediatric): Secondary | ICD-10-CM | POA: Diagnosis not present

## 2018-12-01 DIAGNOSIS — K219 Gastro-esophageal reflux disease without esophagitis: Secondary | ICD-10-CM | POA: Diagnosis not present

## 2018-12-01 DIAGNOSIS — Z0000 Encounter for general adult medical examination without abnormal findings: Secondary | ICD-10-CM | POA: Diagnosis not present

## 2018-12-01 DIAGNOSIS — Z1331 Encounter for screening for depression: Secondary | ICD-10-CM | POA: Diagnosis not present

## 2018-12-01 DIAGNOSIS — Z8249 Family history of ischemic heart disease and other diseases of the circulatory system: Secondary | ICD-10-CM | POA: Diagnosis not present

## 2018-12-01 DIAGNOSIS — M109 Gout, unspecified: Secondary | ICD-10-CM | POA: Diagnosis not present

## 2018-12-04 ENCOUNTER — Other Ambulatory Visit: Payer: Self-pay | Admitting: Internal Medicine

## 2018-12-04 DIAGNOSIS — E785 Hyperlipidemia, unspecified: Secondary | ICD-10-CM

## 2019-01-01 ENCOUNTER — Other Ambulatory Visit: Payer: Self-pay | Admitting: Internal Medicine

## 2019-01-01 DIAGNOSIS — E785 Hyperlipidemia, unspecified: Secondary | ICD-10-CM

## 2019-01-07 ENCOUNTER — Ambulatory Visit
Admission: RE | Admit: 2019-01-07 | Discharge: 2019-01-07 | Disposition: A | Discharge status: Home / Self Care | Payer: Self-pay | Source: Ambulatory Visit | Attending: Internal Medicine | Admitting: Internal Medicine

## 2019-01-07 DIAGNOSIS — E785 Hyperlipidemia, unspecified: Secondary | ICD-10-CM

## 2019-02-03 DIAGNOSIS — R030 Elevated blood-pressure reading, without diagnosis of hypertension: Secondary | ICD-10-CM | POA: Diagnosis not present

## 2019-02-04 DIAGNOSIS — R030 Elevated blood-pressure reading, without diagnosis of hypertension: Secondary | ICD-10-CM | POA: Diagnosis not present

## 2019-06-18 DIAGNOSIS — E7849 Other hyperlipidemia: Secondary | ICD-10-CM | POA: Diagnosis not present

## 2019-10-30 DIAGNOSIS — Z125 Encounter for screening for malignant neoplasm of prostate: Secondary | ICD-10-CM | POA: Diagnosis not present

## 2019-10-30 DIAGNOSIS — E7849 Other hyperlipidemia: Secondary | ICD-10-CM | POA: Diagnosis not present

## 2019-10-30 DIAGNOSIS — M109 Gout, unspecified: Secondary | ICD-10-CM | POA: Diagnosis not present

## 2019-10-30 DIAGNOSIS — Z0000 Encounter for general adult medical examination without abnormal findings: Secondary | ICD-10-CM | POA: Diagnosis not present

## 2019-11-06 DIAGNOSIS — Z1212 Encounter for screening for malignant neoplasm of rectum: Secondary | ICD-10-CM | POA: Diagnosis not present

## 2019-11-06 DIAGNOSIS — R5382 Chronic fatigue, unspecified: Secondary | ICD-10-CM | POA: Diagnosis not present

## 2019-11-06 DIAGNOSIS — Z79899 Other long term (current) drug therapy: Secondary | ICD-10-CM | POA: Diagnosis not present

## 2019-11-06 DIAGNOSIS — R6882 Decreased libido: Secondary | ICD-10-CM | POA: Diagnosis not present

## 2019-11-06 DIAGNOSIS — R5383 Other fatigue: Secondary | ICD-10-CM | POA: Diagnosis not present

## 2019-11-06 DIAGNOSIS — R82998 Other abnormal findings in urine: Secondary | ICD-10-CM | POA: Diagnosis not present

## 2019-11-06 DIAGNOSIS — Z0000 Encounter for general adult medical examination without abnormal findings: Secondary | ICD-10-CM | POA: Diagnosis not present

## 2019-11-06 DIAGNOSIS — Z23 Encounter for immunization: Secondary | ICD-10-CM | POA: Diagnosis not present

## 2019-11-06 DIAGNOSIS — R030 Elevated blood-pressure reading, without diagnosis of hypertension: Secondary | ICD-10-CM | POA: Diagnosis not present

## 2019-11-13 ENCOUNTER — Encounter: Payer: Self-pay | Admitting: Gastroenterology

## 2020-01-04 ENCOUNTER — Ambulatory Visit (AMBULATORY_SURGERY_CENTER): Payer: Self-pay

## 2020-01-04 ENCOUNTER — Other Ambulatory Visit: Payer: Self-pay

## 2020-01-04 VITALS — Ht 68.5 in | Wt 175.0 lb

## 2020-01-04 DIAGNOSIS — Z1211 Encounter for screening for malignant neoplasm of colon: Secondary | ICD-10-CM

## 2020-01-04 MED ORDER — SUTAB 1479-225-188 MG PO TABS: 12.0000 | ORAL_TABLET | ORAL | 0 refills | Status: DC

## 2020-01-04 NOTE — Progress Notes (Signed)
No allergies to soy or egg Pt is not on blood thinners or diet pills Denies issues with sedation/intubation Denies atrial flutter/fib Denies constipation   Emmi instructions given to pt  Pt is aware of Covid safety and care partner requirements.  

## 2020-01-05 ENCOUNTER — Encounter: Payer: Self-pay | Admitting: Gastroenterology

## 2020-01-18 ENCOUNTER — Ambulatory Visit (AMBULATORY_SURGERY_CENTER): Payer: BC Managed Care – PPO | Admitting: Gastroenterology

## 2020-01-18 ENCOUNTER — Other Ambulatory Visit: Payer: Self-pay

## 2020-01-18 ENCOUNTER — Encounter: Payer: Self-pay | Admitting: Gastroenterology

## 2020-01-18 VITALS — BP 96/56 | HR 59 | Temp 97.3°F | Resp 18 | Ht 68.4 in | Wt 175.0 lb

## 2020-01-18 DIAGNOSIS — Z1211 Encounter for screening for malignant neoplasm of colon: Secondary | ICD-10-CM | POA: Diagnosis not present

## 2020-01-18 DIAGNOSIS — D123 Benign neoplasm of transverse colon: Secondary | ICD-10-CM | POA: Diagnosis not present

## 2020-01-18 MED ORDER — SODIUM CHLORIDE 0.9 % IV SOLN: 500.0000 mL | Freq: Once | INTRAVENOUS | Status: DC

## 2020-01-18 NOTE — Op Note (Signed)
[place] Patient Name: Eric Aguirre Procedure Date: 01/18/2020 12:02 PM MRN: [id number] Endoscopist: Remo Lipps P. Havery Moros , MD Age: 59 Referring MD:  Date of Birth: 1961-02-25 Gender: Male Account #: [id number] Procedure:                Colonoscopy Indications:              Screening for colorectal malignant neoplasm Medicines:                Monitored Anesthesia Care Procedure:                Pre-Anesthesia Assessment:                           - Prior to the procedure, a History and Physical                            was performed, and patient medications and                            allergies were reviewed. The patient's tolerance of                            previous anesthesia was also reviewed. The risks                            and benefits of the procedure and the sedation                            options and risks were discussed with the patient.                            All questions were answered, and informed consent                            was obtained. Prior Anticoagulants: The patient has                            taken no previous anticoagulant or antiplatelet                            agents. ASA Grade Assessment: II - A patient with                            mild systemic disease. After reviewing the risks                            and benefits, the patient was deemed in                            satisfactory condition to undergo the procedure.                           After obtaining informed consent, the colonoscope  was passed under direct vision. Throughout the                            procedure, the patient's blood pressure, pulse, and                            oxygen saturations were monitored continuously. The                            Colonoscope was introduced through the anus and                            advanced to the the cecum, identified by                            appendiceal orifice  and ileocecal valve. The                            colonoscopy was performed without difficulty. The                            patient tolerated the procedure well. The quality                            of the bowel preparation was adequate. The                            ileocecal valve, appendiceal orifice, and rectum                            were photographed. Scope In: 31:49:70 PM Scope Out: 12:32:03 PM Scope Withdrawal Time: 0 hours 22 minutes 19 seconds  Total Procedure Duration: 0 hours 25 minutes 14 seconds  Findings:                 The perianal and digital rectal examinations were                            normal.                           Two sessile polyps were found in the transverse                            colon. The polyps were 3 mm in size. These polyps                            were removed with a cold snare. Resection and                            retrieval were complete.                           Multiple small-mouthed diverticula were found in  the left colon and right colon.                           Internal hemorrhoids were found during                            retroflexion. The hemorrhoids were small.                           The exam was otherwise without abnormality. Complications:            No immediate complications. Estimated blood loss:                            Minimal. Estimated Blood Loss:     Estimated blood loss was minimal. Impression:               - Two 3 mm polyps in the transverse colon, removed                            with a cold snare. Resected and retrieved.                           - Diverticulosis in the left colon and in the right                            colon.                           - Internal hemorrhoids.                           - The examination was otherwise normal. Recommendation:           - Patient has a contact number available for                            emergencies. The  signs and symptoms of potential                            delayed complications were discussed with the                            patient. Return to normal activities tomorrow.                            Written discharge instructions were provided to the                            patient.                           - Resume previous diet.                           - Continue present medications.                           -  Await pathology results. Remo Lipps P. [name], MD 01/18/2020 12:36:01 PM This report has been signed electronically.

## 2020-01-18 NOTE — Progress Notes (Signed)
Pt's states no medical or surgical changes since previsit or office visit. 

## 2020-01-18 NOTE — Patient Instructions (Signed)
HANDOUTS PROVIDED ON: POLYPS, DIVERTICULOSIS, & HEMORRHOIDS  The polyps removed today have been sent for pathology.  The results can take 1-3 weeks to receive.  When your next colonoscopy should occur will be based on the pathology results.    You may resume your previous diet and medication schedule.  Thank you for allowing us to care for you today!!!   YOU HAD AN ENDOSCOPIC PROCEDURE TODAY AT THE [facility]:   Refer to the procedure report that was given to you for any specific questions about what was found during the examination.  If the procedure report does not answer your questions, please call your gastroenterologist to clarify.  If you requested that your care partner not be given the details of your procedure findings, then the procedure report has been included in a sealed envelope for you to review at your convenience later.  YOU SHOULD EXPECT: Some feelings of bloating in the abdomen. Passage of more gas than usual.  Walking can help get rid of the air that was put into your GI tract during the procedure and reduce the bloating. If you had a lower endoscopy (such as a colonoscopy or flexible sigmoidoscopy) you may notice spotting of blood in your stool or on the toilet paper. If you underwent a bowel prep for your procedure, you may not have a normal bowel movement for a few days.  Please Note:  You might notice some irritation and congestion in your nose or some drainage.  This is from the oxygen used during your procedure.  There is no need for concern and it should clear up in a day or so.  SYMPTOMS TO REPORT IMMEDIATELY:   Following lower endoscopy (colonoscopy or flexible sigmoidoscopy):  Excessive amounts of blood in the stool  Significant tenderness or worsening of abdominal pains  Swelling of the abdomen that is new, acute  Fever of 100F or higher  For urgent or emergent issues, a gastroenterologist can be reached at any hour by calling [phone]. Do  not use MyChart messaging for urgent concerns.    DIET:  We do recommend a small meal at first, but then you may proceed to your regular diet.  Drink plenty of fluids but you should avoid alcoholic beverages for 24 hours.  ACTIVITY:  You should plan to take it easy for the rest of today and you should NOT DRIVE or use heavy machinery until tomorrow (because of the sedation medicines used during the test).    FOLLOW UP: Our staff will call the number listed on your records 48-72 hours following your procedure to check on you and address any questions or concerns that you may have regarding the information given to you following your procedure. If we do not reach you, we will leave a message.  We will attempt to reach you two times.  During this call, we will ask if you have developed any symptoms of COVID 19. If you develop any symptoms (ie: fever, flu-like symptoms, shortness of breath, cough etc.) before then, please call [phone].  If you test positive for Covid 19 in the 2 weeks post procedure, please call and report this information to us.    If any biopsies were taken you will be contacted by phone or by letter within the next 1-3 weeks.  Please call us at [phone] if you have not heard about the biopsies in 3 weeks.    SIGNATURES/CONFIDENTIALITY: You and/or your care partner have signed paperwork which will   be entered into your electronic medical record.  These signatures attest to the fact that that the information above on your After Visit Summary has been reviewed and is understood.  Full responsibility of the confidentiality of this discharge information lies with you and/or your care-partner. 

## 2020-01-18 NOTE — Progress Notes (Signed)
Called to room to assist during endoscopic procedure.  Patient ID and intended procedure confirmed with present staff. Received instructions for my participation in the procedure from the performing physician.  

## 2020-01-18 NOTE — Progress Notes (Signed)
PT taken to PACU. Monitors in place. VSS. Report given to RN. 

## 2020-01-20 ENCOUNTER — Telehealth: Payer: Self-pay

## 2020-01-20 NOTE — Telephone Encounter (Signed)
  Follow up Call-  Call back number 01/18/2020  Post procedure Call Back phone  # [phone]  Permission to leave phone message Yes  Some recent data might be hidden     Patient questions:  Do you have a fever, pain , or abdominal swelling? No. Pain Score  0 *  Have you tolerated food without any problems? Yes.    Have you been able to return to your normal activities? Yes.    Do you have any questions about your discharge instructions: Diet   No. Medications  No. Follow up visit  No.  Do you have questions or concerns about your Care? No.  Actions: * If pain score is 4 or above: 1. No action needed, pain <4.Have you developed a fever since your procedure? no  2.   Have you had an respiratory symptoms (SOB or cough) since your procedure? no  3.   Have you tested positive for COVID 19 since your procedure no  4.   Have you had any family members/close contacts diagnosed with the COVID 19 since your procedure?  no   If yes to any of these questions please route to Joylene John, RN and Joella Prince, RN

## 2020-01-21 ENCOUNTER — Encounter: Payer: Self-pay | Admitting: Gastroenterology

## 2020-10-31 DIAGNOSIS — L738 Other specified follicular disorders: Secondary | ICD-10-CM | POA: Diagnosis not present

## 2020-10-31 DIAGNOSIS — D225 Melanocytic nevi of trunk: Secondary | ICD-10-CM | POA: Diagnosis not present

## 2020-10-31 DIAGNOSIS — L814 Other melanin hyperpigmentation: Secondary | ICD-10-CM | POA: Diagnosis not present

## 2020-10-31 DIAGNOSIS — L918 Other hypertrophic disorders of the skin: Secondary | ICD-10-CM | POA: Diagnosis not present

## 2020-10-31 DIAGNOSIS — L821 Other seborrheic keratosis: Secondary | ICD-10-CM | POA: Diagnosis not present

## 2020-12-08 DIAGNOSIS — Z125 Encounter for screening for malignant neoplasm of prostate: Secondary | ICD-10-CM | POA: Diagnosis not present

## 2020-12-08 DIAGNOSIS — M109 Gout, unspecified: Secondary | ICD-10-CM | POA: Diagnosis not present

## 2020-12-08 DIAGNOSIS — E785 Hyperlipidemia, unspecified: Secondary | ICD-10-CM | POA: Diagnosis not present

## 2020-12-15 DIAGNOSIS — Z1212 Encounter for screening for malignant neoplasm of rectum: Secondary | ICD-10-CM | POA: Diagnosis not present

## 2020-12-15 DIAGNOSIS — Z0000 Encounter for general adult medical examination without abnormal findings: Secondary | ICD-10-CM | POA: Diagnosis not present

## 2020-12-15 DIAGNOSIS — Z1389 Encounter for screening for other disorder: Secondary | ICD-10-CM | POA: Diagnosis not present

## 2020-12-15 DIAGNOSIS — Z23 Encounter for immunization: Secondary | ICD-10-CM | POA: Diagnosis not present

## 2020-12-15 DIAGNOSIS — R82998 Other abnormal findings in urine: Secondary | ICD-10-CM | POA: Diagnosis not present

## 2020-12-15 DIAGNOSIS — Z1331 Encounter for screening for depression: Secondary | ICD-10-CM | POA: Diagnosis not present

## 2021-05-29 IMAGING — CT CT HEART SCORING
3 series · 14 of 20 positions shown, 16 images · non-contrast
Comparison: None.

CLINICAL DATA: 58-year-old male. Family history of coronary
disease.

EXAM:
CT HEART FOR CALCIUM SCORING
TECHNIQUE: CT heart was performed on a 256 channel system using prospective ECG
gating. A scout and noncontrast exam (for calcium scoring) were
performed. Note that this exam targets the heart and the chest was
not imaged in its entirety.

[Series 2: calcium scoring 2.00 qr36 bestdiast 68% · axial · 0.38mm/px · z∈[+1662,+1746]mm · 4 of 70 slices shown]
[im 14/70  vessel]
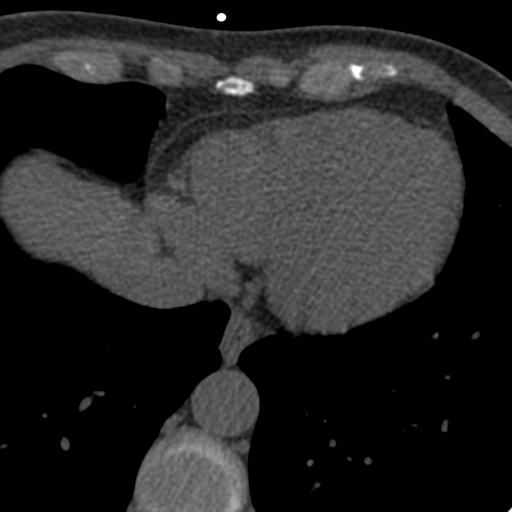
[im 28/70  vessel]
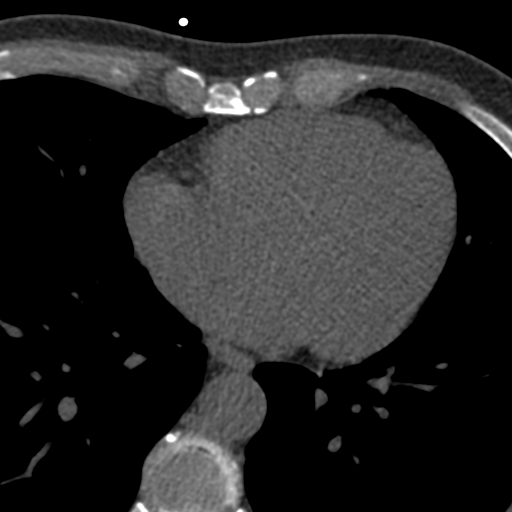
[im 42/70  vessel]
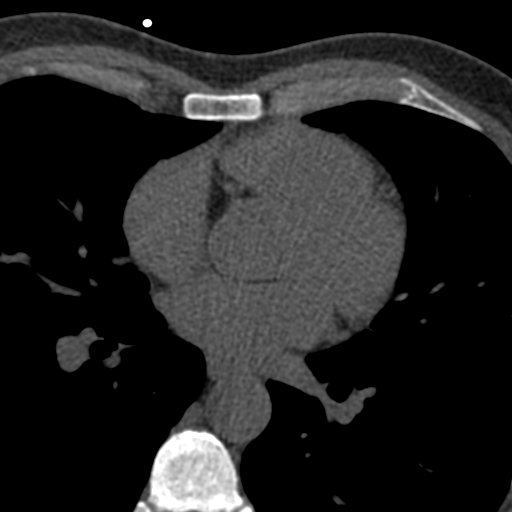
[im 56/70  vessel]
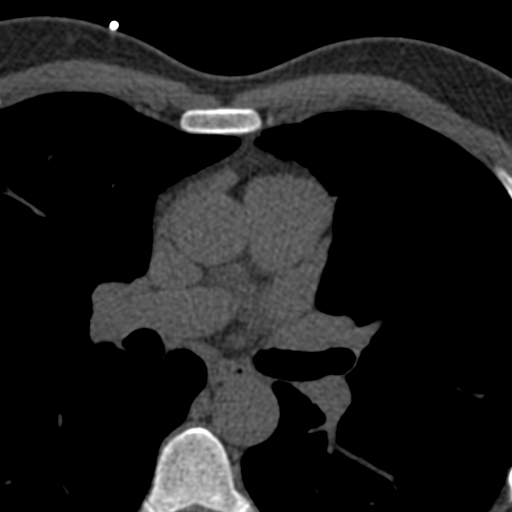

[Series 3: calcium scoring 2.00 br40 bestdiast 68% fov · axial · 0.62mm/px · z∈[+1658,+1750]mm · 5 of 70 slices shown, 7 images]
[im 12/70  vessel]
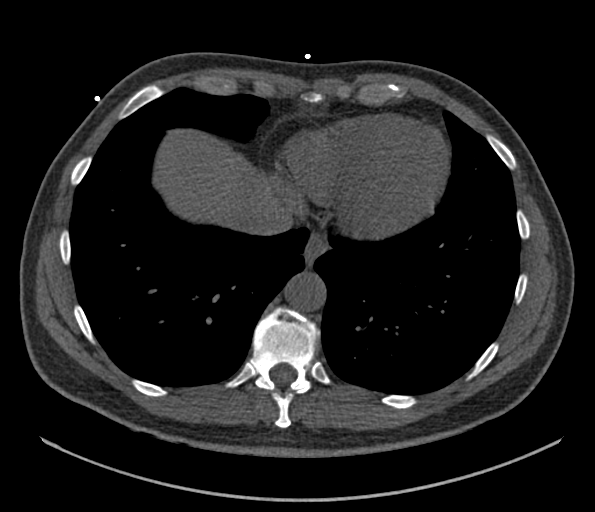
[im 12/70  lung]
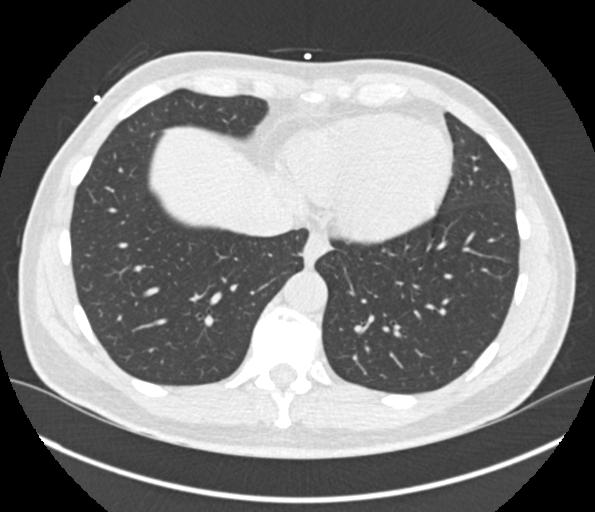
[im 24/70  vessel]
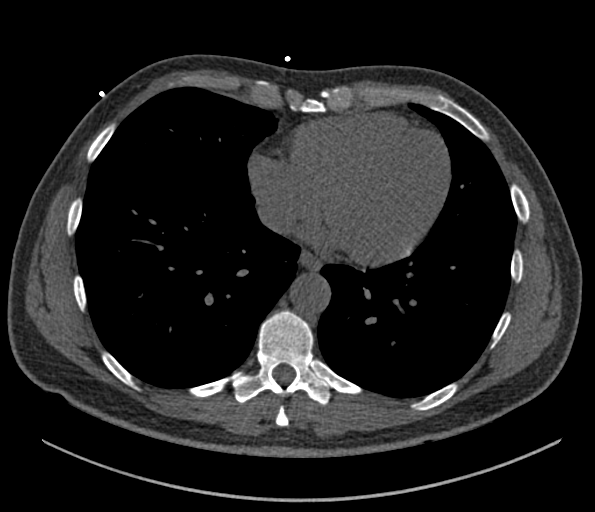
[im 35/70  vessel]
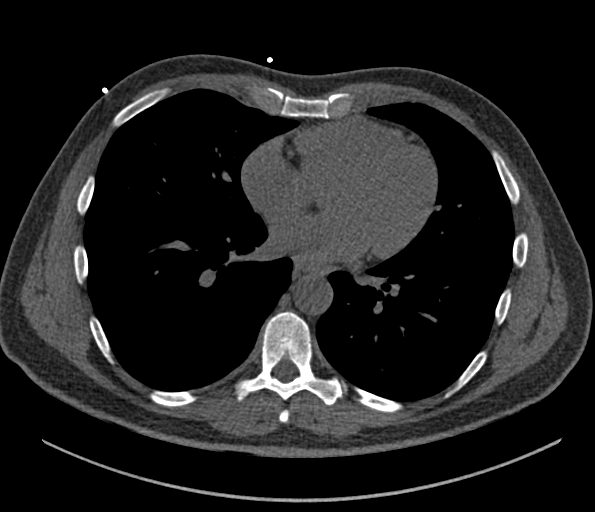
[im 47/70  vessel]
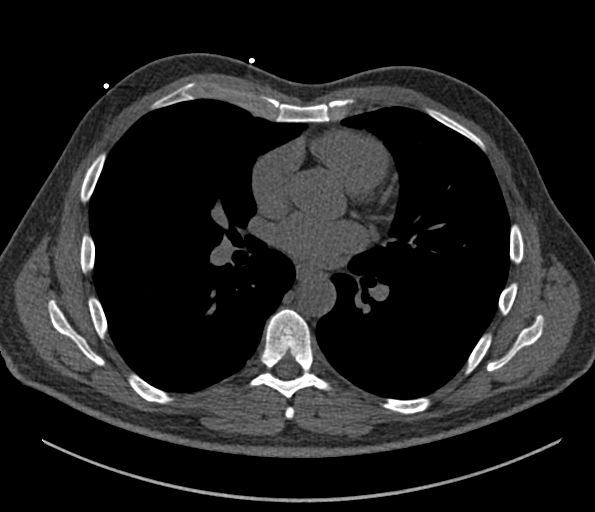
[im 58/70  vessel]
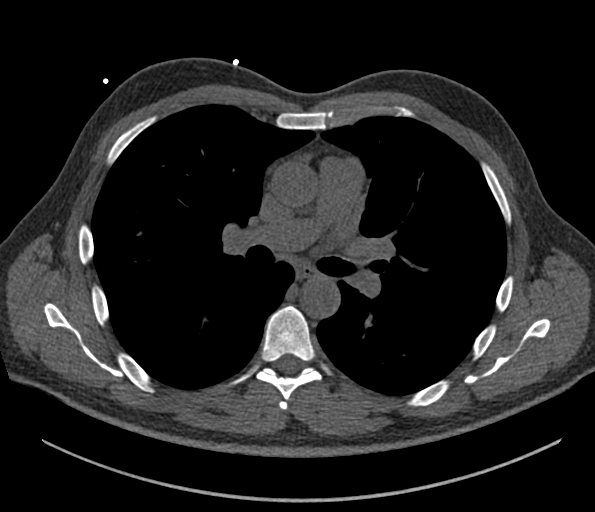
[im 58/70  lung]
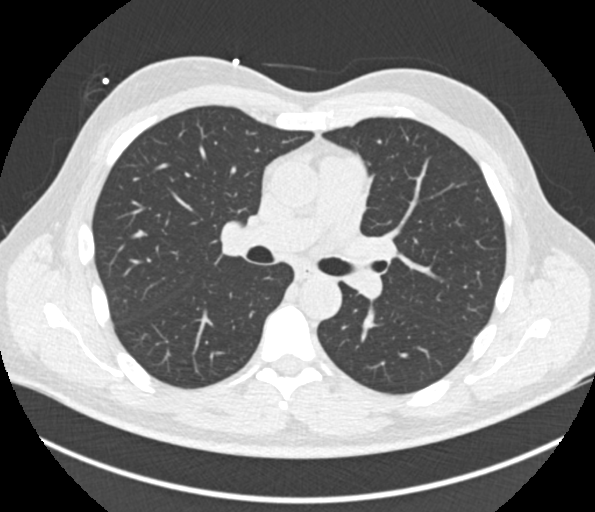

[Series 9: calcium scoring 2.00 br60 bestdiast 68% fov · axial · 0.62mm/px · z∈[+1658,+1750]mm · 5 of 70 slices shown]
[im 12/70  vessel]
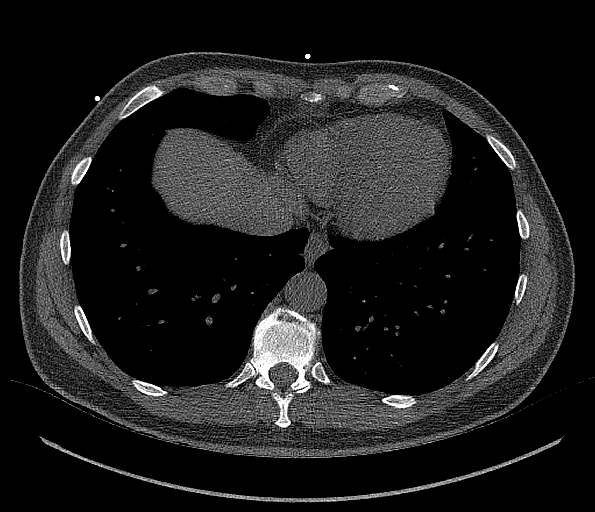
[im 24/70  vessel]
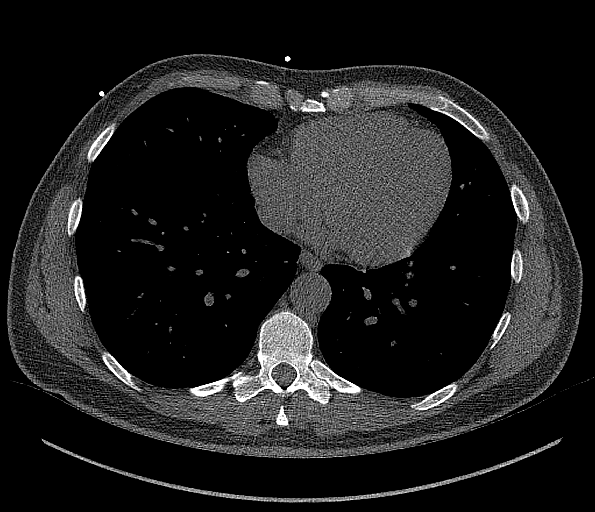
[im 35/70  vessel]
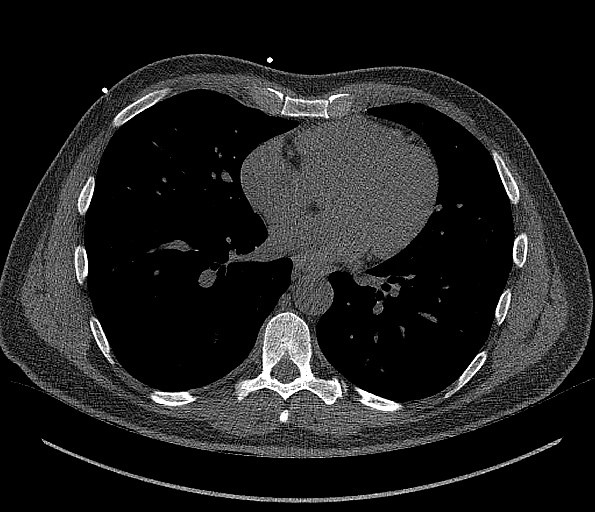
[im 47/70  vessel]
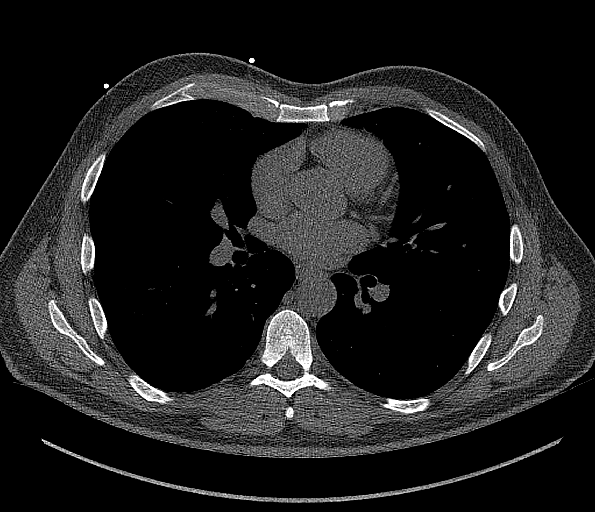
[im 58/70  vessel]
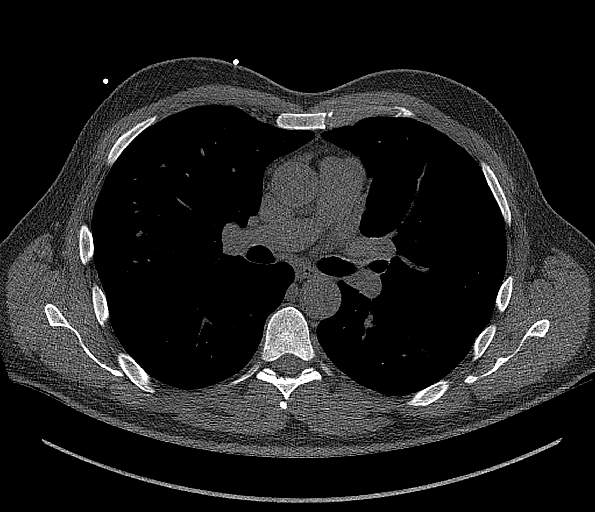

[14 of 20 positions shown; findings below may reference images not displayed]

FINDINGS: Technical quality: Good

No coronary artery calcification.

CORONARY CALCIUM

Total Agatston Score: 0

[HOSPITAL] percentile: 0 th

Ascending aorta ( <  40 mm): 28

EXTRACARDIAC FINDINGS:

Limited view of the lung parenchyma demonstrates no suspicious
nodularity. Airways are normal.

Limited view of the mediastinum demonstrates no adenopathy.
Esophagus normal.

Limited view of the upper abdomen unremarkable.

Limited view of the skeleton and chest wall is unremarkable.
IMPRESSION: 1. No coronary artery calcification.

2. Total Agatston Score: 0

3. MESA age and sex matched database percentile: 0th

## 2021-10-26 DIAGNOSIS — Z0000 Encounter for general adult medical examination without abnormal findings: Secondary | ICD-10-CM | POA: Diagnosis not present

## 2021-10-26 DIAGNOSIS — M1A9XX0 Chronic gout, unspecified, without tophus (tophi): Secondary | ICD-10-CM | POA: Diagnosis not present

## 2021-10-26 DIAGNOSIS — Z125 Encounter for screening for malignant neoplasm of prostate: Secondary | ICD-10-CM | POA: Diagnosis not present

## 2021-10-30 ENCOUNTER — Ambulatory Visit: Payer: Self-pay

## 2021-10-30 ENCOUNTER — Ambulatory Visit (INDEPENDENT_AMBULATORY_CARE_PROVIDER_SITE_OTHER): Payer: BC Managed Care – PPO | Admitting: Family Medicine

## 2021-10-30 ENCOUNTER — Ambulatory Visit (INDEPENDENT_AMBULATORY_CARE_PROVIDER_SITE_OTHER)
Admission: RE | Admit: 2021-10-30 | Discharge: 2021-10-30 | Disposition: A | Discharge status: Home / Self Care | Payer: BC Managed Care – PPO | Source: Ambulatory Visit | Attending: Family Medicine | Admitting: Family Medicine

## 2021-10-30 VITALS — BP 158/92 | HR 78 | Ht 68.0 in | Wt 173.6 lb

## 2021-10-30 DIAGNOSIS — Z0000 Encounter for general adult medical examination without abnormal findings: Secondary | ICD-10-CM | POA: Diagnosis not present

## 2021-10-30 DIAGNOSIS — M19012 Primary osteoarthritis, left shoulder: Secondary | ICD-10-CM | POA: Diagnosis not present

## 2021-10-30 DIAGNOSIS — M25512 Pain in left shoulder: Secondary | ICD-10-CM

## 2021-10-30 DIAGNOSIS — M1A9XX0 Chronic gout, unspecified, without tophus (tophi): Secondary | ICD-10-CM | POA: Diagnosis not present

## 2021-10-30 DIAGNOSIS — G8929 Other chronic pain: Secondary | ICD-10-CM | POA: Diagnosis not present

## 2021-10-30 DIAGNOSIS — I10 Essential (primary) hypertension: Secondary | ICD-10-CM | POA: Diagnosis not present

## 2021-10-30 DIAGNOSIS — G250 Essential tremor: Secondary | ICD-10-CM

## 2021-10-30 MED ORDER — PROPRANOLOL HCL ER 60 MG PO CP24: 60.0000 mg | ORAL_CAPSULE | Freq: Every day | ORAL | 3 refills | Status: AC | PRN

## 2021-10-30 NOTE — Progress Notes (Signed)
I, Eric Aguirre, LAT, ATC acting as a scribe for Eric Leader, MD.  Subjective:    CC: L shoulder pain  HPI: Pt is a 61 y/o male c/o L shoulder pain ongoing since April. Pt relates the start of the L shoulder pain to doing burpees. Pt locates pain to the anterior aspect of the L shoulder w/ radiating pain into the L upper arm. Pt notes decreased AROM.  He likes to exercise by cycling hiking and doing kayaking.  He has had to change his activity a little bit because of shoulder pain.  He also notes that he has developed a tremor with motion and activity.  He notes that the little bothersome but has not gotten his way or prevented her from doing things.  Neck pain: no Radiates: no UE numbness/tingling: UE weakness: Aggravates: horz add, overhead Treatments tried: IBU, naproxen   Pertinent review of Systems: No fevers or chills  Relevant historical information: He lives in [place].  He just moved there from [place].   Objective:    Vitals:   10/30/21 1232  BP: (!) 158/92  Pulse: 78  SpO2: 97%   General: Well Developed, well nourished, and in no acute distress.   MSK: Left shoulder: Normal-appearing Normal motion pain with abduction. Positive Hawkins and Neer's test.  Positive empty can test. Negative Yergason's and speeds test. Intact strength.  Neuro: Small bilateral tremor with motion upper extremities.  No tremor at rest.  Lab and Radiology Results  Diagnostic Limited MSK Ultrasound of: Left shoulder Biceps tendon intact normal. Subscapularis tendon is intact. Supraspinatus tendon is intact with mild subacromial bursitis. Infraspinatus tendon is intact. AC joint degenerative with effusion. Impression: Subacromial bursitis   X-ray images left shoulder obtained today personally and independently interpreted Minimal before meals and glenohumeral DJD.  No acute fractures. Await formal radiology review  Impression and Recommendations:     Assessment and Plan: 61 y.o. male with chronic left shoulder pain.  Pain ongoing for since April of this year.  Pain thought to be due primarily to subacromial bursitis.  Plan for physical therapy.  Lives in [place] so we will pick a physical therapy location near to where he lives.  If not improving reasonable neck step would be either MRI or subacromial injection.  He can return here for the services or we probably could arrange for MRI and [place] and I am sure there is great sports medicine doctors orthopedic doctors in [place] that he can use.  Additionally has a bilaterally essential tremor.  This is becoming a little annoying.  We talked about treatment.  Intermittent as needed propranolol is reasonable here.  I do not think he needs to take it every day but certainly taking it as needed could be helpful.  We will use the lowest dose extended release propranolol.  If this is helpful it may be reasonable for his primary care provider to take over this at some point.Marland Kitchen  PDMP not reviewed this encounter. Orders Placed This Encounter  Procedures   [place] LIMITED JOINT SPACE STRUCTURES UP LEFT(NO LINKED CHARGES)    Order Specific Question:   Reason for Exam (SYMPTOM  OR DIAGNOSIS REQUIRED)    Answer:   left shoulder pain    Order Specific Question:   Preferred imaging location?    Answer:   [place]   DG Shoulder Left    Standing Status:   Future    Number of Occurrences:   1  Standing Expiration Date:   10/31/2022    Order Specific Question:   Reason for Exam (SYMPTOM  OR DIAGNOSIS REQUIRED)    Answer:   eval left shoulder    Order Specific Question:   Preferred imaging location?    Answer:   [facility]   Ambulatory referral to Physical Therapy    Referral Priority:   Routine    Referral Type:   Physical Medicine    Referral Reason:   Specialty Services Required    Requested Specialty:   Physical Therapy    Number of Visits Requested:   1   Meds  ordered this encounter  Medications   propranolol ER (INDERAL LA) 60 MG 24 hr capsule    Sig: Take 1 capsule (60 mg total) by mouth daily as needed (for essential tremor).    Dispense:  90 capsule    Refill:  3    Discussed warning signs or symptoms. Please see discharge instructions. Patient expresses understanding.   The above documentation has been reviewed and is accurate and complete Eric Aguirre, M.D.

## 2021-10-30 NOTE — Patient Instructions (Addendum)
Thank you for coming in today.   Plan for PT.   Recheck as needed.   We can do phone call or video visit if needed.   Please get an Xray today before you leave   Propranolol as needed for tremor

## 2021-10-31 NOTE — Progress Notes (Signed)
Left shoulder x-ray shows mild arthritis changes.

## 2021-11-16 DIAGNOSIS — M25612 Stiffness of left shoulder, not elsewhere classified: Secondary | ICD-10-CM | POA: Diagnosis not present

## 2021-11-16 DIAGNOSIS — M6281 Muscle weakness (generalized): Secondary | ICD-10-CM | POA: Diagnosis not present

## 2021-11-16 DIAGNOSIS — M7552 Bursitis of left shoulder: Secondary | ICD-10-CM | POA: Diagnosis not present

## 2021-11-16 DIAGNOSIS — M25512 Pain in left shoulder: Secondary | ICD-10-CM | POA: Diagnosis not present

## 2021-11-23 DIAGNOSIS — M25612 Stiffness of left shoulder, not elsewhere classified: Secondary | ICD-10-CM | POA: Diagnosis not present

## 2021-11-23 DIAGNOSIS — M6281 Muscle weakness (generalized): Secondary | ICD-10-CM | POA: Diagnosis not present

## 2021-11-23 DIAGNOSIS — M25512 Pain in left shoulder: Secondary | ICD-10-CM | POA: Diagnosis not present

## 2021-11-23 DIAGNOSIS — M7552 Bursitis of left shoulder: Secondary | ICD-10-CM | POA: Diagnosis not present

## 2021-11-24 DIAGNOSIS — B351 Tinea unguium: Secondary | ICD-10-CM | POA: Diagnosis not present

## 2021-11-28 DIAGNOSIS — M25512 Pain in left shoulder: Secondary | ICD-10-CM | POA: Diagnosis not present

## 2021-11-28 DIAGNOSIS — M7552 Bursitis of left shoulder: Secondary | ICD-10-CM | POA: Diagnosis not present

## 2021-11-28 DIAGNOSIS — M6281 Muscle weakness (generalized): Secondary | ICD-10-CM | POA: Diagnosis not present

## 2021-11-28 DIAGNOSIS — M25612 Stiffness of left shoulder, not elsewhere classified: Secondary | ICD-10-CM | POA: Diagnosis not present

## 2021-12-14 DIAGNOSIS — M6281 Muscle weakness (generalized): Secondary | ICD-10-CM | POA: Diagnosis not present

## 2021-12-14 DIAGNOSIS — M7552 Bursitis of left shoulder: Secondary | ICD-10-CM | POA: Diagnosis not present

## 2021-12-14 DIAGNOSIS — M25512 Pain in left shoulder: Secondary | ICD-10-CM | POA: Diagnosis not present

## 2021-12-14 DIAGNOSIS — M25612 Stiffness of left shoulder, not elsewhere classified: Secondary | ICD-10-CM | POA: Diagnosis not present

## 2022-01-23 DIAGNOSIS — I10 Essential (primary) hypertension: Secondary | ICD-10-CM | POA: Diagnosis not present

## 2022-01-23 DIAGNOSIS — B351 Tinea unguium: Secondary | ICD-10-CM | POA: Diagnosis not present

## 2022-02-16 DIAGNOSIS — I10 Essential (primary) hypertension: Secondary | ICD-10-CM | POA: Diagnosis not present

## 2022-02-16 DIAGNOSIS — R748 Abnormal levels of other serum enzymes: Secondary | ICD-10-CM | POA: Diagnosis not present

## 2022-02-16 DIAGNOSIS — B351 Tinea unguium: Secondary | ICD-10-CM | POA: Diagnosis not present

## 2022-02-16 DIAGNOSIS — Z23 Encounter for immunization: Secondary | ICD-10-CM | POA: Diagnosis not present

## 2022-11-02 DIAGNOSIS — Z125 Encounter for screening for malignant neoplasm of prostate: Secondary | ICD-10-CM | POA: Diagnosis not present

## 2022-11-02 DIAGNOSIS — Z0000 Encounter for general adult medical examination without abnormal findings: Secondary | ICD-10-CM | POA: Diagnosis not present

## 2022-11-05 DIAGNOSIS — I10 Essential (primary) hypertension: Secondary | ICD-10-CM | POA: Diagnosis not present

## 2022-11-05 DIAGNOSIS — M1A9XX0 Chronic gout, unspecified, without tophus (tophi): Secondary | ICD-10-CM | POA: Diagnosis not present

## 2022-11-05 DIAGNOSIS — K219 Gastro-esophageal reflux disease without esophagitis: Secondary | ICD-10-CM | POA: Diagnosis not present

## 2023-11-15 DIAGNOSIS — Z125 Encounter for screening for malignant neoplasm of prostate: Secondary | ICD-10-CM | POA: Diagnosis not present

## 2023-11-15 DIAGNOSIS — R7303 Prediabetes: Secondary | ICD-10-CM | POA: Diagnosis not present

## 2023-11-15 DIAGNOSIS — I10 Essential (primary) hypertension: Secondary | ICD-10-CM | POA: Diagnosis not present

## 2023-11-15 DIAGNOSIS — Z0000 Encounter for general adult medical examination without abnormal findings: Secondary | ICD-10-CM | POA: Diagnosis not present

## 2023-11-18 DIAGNOSIS — L649 Androgenic alopecia, unspecified: Secondary | ICD-10-CM | POA: Diagnosis not present

## 2023-11-18 DIAGNOSIS — M1A9XX0 Chronic gout, unspecified, without tophus (tophi): Secondary | ICD-10-CM | POA: Diagnosis not present

## 2023-11-18 DIAGNOSIS — Z0000 Encounter for general adult medical examination without abnormal findings: Secondary | ICD-10-CM | POA: Diagnosis not present

## 2023-11-18 DIAGNOSIS — K219 Gastro-esophageal reflux disease without esophagitis: Secondary | ICD-10-CM | POA: Diagnosis not present

## 2023-11-18 DIAGNOSIS — I10 Essential (primary) hypertension: Secondary | ICD-10-CM | POA: Diagnosis not present
